# Patient Record
Sex: Male | Born: 1970 | Race: White | Hispanic: No | Marital: Married | Smoking: Never smoker
Health system: Southern US, Community
[De-identification: ages and names within clinical notes are randomized; demographics above are authoritative.]

## PROBLEM LIST (undated history)

## (undated) DIAGNOSIS — E8881 Metabolic syndrome: Secondary | ICD-10-CM

## (undated) DIAGNOSIS — J309 Allergic rhinitis, unspecified: Secondary | ICD-10-CM

## (undated) DIAGNOSIS — K602 Anal fissure, unspecified: Secondary | ICD-10-CM

## (undated) DIAGNOSIS — J45909 Unspecified asthma, uncomplicated: Secondary | ICD-10-CM

## (undated) DIAGNOSIS — B372 Candidiasis of skin and nail: Secondary | ICD-10-CM

## (undated) DIAGNOSIS — L0591 Pilonidal cyst without abscess: Secondary | ICD-10-CM

## (undated) DIAGNOSIS — S96819A Strain of other specified muscles and tendons at ankle and foot level, unspecified foot, initial encounter: Secondary | ICD-10-CM

## (undated) DIAGNOSIS — M549 Dorsalgia, unspecified: Secondary | ICD-10-CM

## (undated) DIAGNOSIS — E291 Testicular hypofunction: Secondary | ICD-10-CM

## (undated) DIAGNOSIS — N529 Male erectile dysfunction, unspecified: Secondary | ICD-10-CM

## (undated) DIAGNOSIS — K219 Gastro-esophageal reflux disease without esophagitis: Secondary | ICD-10-CM

## (undated) DIAGNOSIS — E669 Obesity, unspecified: Secondary | ICD-10-CM

## (undated) DIAGNOSIS — R7301 Impaired fasting glucose: Secondary | ICD-10-CM

## (undated) DIAGNOSIS — G4733 Obstructive sleep apnea (adult) (pediatric): Secondary | ICD-10-CM

## (undated) HISTORY — DX: Obesity, unspecified: E66.9

## (undated) HISTORY — DX: Testicular hypofunction: E29.1

## (undated) HISTORY — PX: LASIK: SHX215

## (undated) HISTORY — DX: Male erectile dysfunction, unspecified: N52.9

## (undated) HISTORY — DX: Candidiasis of skin and nail: B37.2

## (undated) HISTORY — DX: Metabolic syndrome: E88.81

## (undated) HISTORY — DX: Strain of other specified muscles and tendons at ankle and foot level, unspecified foot, initial encounter: S96.819A

## (undated) HISTORY — DX: Pilonidal cyst without abscess: L05.91

## (undated) HISTORY — DX: Unspecified asthma, uncomplicated: J45.909

## (undated) HISTORY — PX: VASECTOMY: SHX75

## (undated) HISTORY — DX: Obstructive sleep apnea (adult) (pediatric): G47.33

## (undated) HISTORY — DX: Allergic rhinitis, unspecified: J30.9

## (undated) HISTORY — DX: Metabolic syndrome: E88.810

## (undated) HISTORY — DX: Impaired fasting glucose: R73.01

## (undated) HISTORY — DX: Dorsalgia, unspecified: M54.9

## (undated) HISTORY — DX: Anal fissure, unspecified: K60.2

## (undated) HISTORY — DX: Gastro-esophageal reflux disease without esophagitis: K21.9

---

## 2002-12-16 HISTORY — PX: KNEE ARTHROSCOPY: SUR90

## 2003-04-25 ENCOUNTER — Ambulatory Visit (HOSPITAL_BASED_OUTPATIENT_CLINIC_OR_DEPARTMENT_OTHER): Admission: RE | Admit: 2003-04-25 | Discharge: 2003-04-25 | Payer: Self-pay | Admitting: Orthopedic Surgery

## 2013-10-18 ENCOUNTER — Encounter: Payer: Self-pay | Admitting: Gastroenterology

## 2013-11-17 ENCOUNTER — Ambulatory Visit (INDEPENDENT_AMBULATORY_CARE_PROVIDER_SITE_OTHER): Payer: BC Managed Care – PPO | Admitting: Gastroenterology

## 2013-11-17 ENCOUNTER — Encounter: Payer: Self-pay | Admitting: Gastroenterology

## 2013-11-17 VITALS — BP 134/72 | HR 88 | Ht 76.0 in | Wt 255.0 lb

## 2013-11-17 DIAGNOSIS — K921 Melena: Secondary | ICD-10-CM

## 2013-11-17 DIAGNOSIS — Z8371 Family history of colonic polyps: Secondary | ICD-10-CM

## 2013-11-17 MED ORDER — MOVIPREP 100 G PO SOLR: 1.0000 | Freq: Once | ORAL | Status: DC

## 2013-11-17 NOTE — Progress Notes (Signed)
HPI: This is a   very pleasant 42 year old man whom I am meeting for the first time today.  He has seen blood around stool, was told he had fissure.  This occurs rarely.  Not associated with anal pain. Was told to have stool testing prior to his annual PCP visit.  Mother has had colon polyps; Father had colon polyps (has had 3 year intervals twice, now out to 5 years).  Overall weight stable in range.  No significant bowel issues with constipation or dirrhea.  Aunt had colon cancer at age 26.   Review of systems: Pertinent positive and negative review of systems were noted in the above HPI section. Complete review of systems was performed and was otherwise normal.    Past Medical History  Diagnosis Date  . AR (allergic rhinitis)   . Obesity   . Asthma   . GERD (gastroesophageal reflux disease)   . Intertriginous candidiasis   . Anal fissure   . Pilonidal cyst   . Hypogonadism male   . Metabolic syndrome     Past Surgical History  Procedure Laterality Date  . Knee arthroscopy Left 2004    Current Outpatient Prescriptions  Medication Sig Dispense Refill  . TESTIM 50 MG/5GM GEL As directed       No current facility-administered medications for this visit.    Allergies as of 11/17/2013  . (No Known Allergies)    Family History  Problem Relation Age of Onset  . Hypertension Father   . Diabetes Mellitus II Father   . Hyperlipidemia Father   . Thyroid disease Father   . Thyroid disease Mother   . COPD Paternal Grandmother   . Heart attack Paternal Grandfather   . Colon cancer Paternal Aunt 30  . Colon polyps Father   . Colon polyps Mother     History   Social History  . Marital Status: Unknown    Spouse Name: N/A    Number of Children: 3  . Years of Education: N/A   Occupational History  . Passenger transport manager    Social History Main Topics  . Smoking status: Never Smoker   . Smokeless tobacco: Never Used  . Alcohol Use: Yes     Comment: social   . Drug Use: No  . Sexual Activity: Not on file   Other Topics Concern  . Not on file   Social History Narrative   Daily caffeine        Physical Exam: BP 134/72  Pulse 88  Ht 6\' 4"  (1.93 m)  Wt 255 lb (115.667 kg)  BMI 31.05 kg/m2 Constitutional: generally well-appearing Psychiatric: alert and oriented x3 Eyes: extraocular movements intact Mouth: oral pharynx moist, no lesions Neck: supple no lymphadenopathy Cardiovascular: heart regular rate and rhythm Lungs: clear to auscultation bilaterally Abdomen: soft, nontender, nondistended, no obvious ascites, no peritoneal signs, normal bowel sounds Extremities: no lower extremity edema bilaterally Skin: no lesions on visible extremities Rectal exam deferred for upcoming colonoscopy   Assessment and plan: 42 y.o. male with  minor intermittent rectal bleeding, Hemoccult positive stools, family history of both adenomatous colon polyps and colon cancer  He is at elevated risk for colon cancer, he has Hemoccult-positive stools and sees intermittent blood. I suspect the bleeding is anorectal, benign such as a fissure as he was told by his primary care physician. I like to proceed with colonoscopy at his soonest convenience given Hemoccult-positive stool family history.

## 2013-11-17 NOTE — Patient Instructions (Signed)
You will be set up for a colonoscopy (early on a Monday if possible) for heme + stool, FH of colon polyps.

## 2014-01-17 ENCOUNTER — Other Ambulatory Visit: Payer: BC Managed Care – PPO | Admitting: Gastroenterology

## 2015-07-20 ENCOUNTER — Other Ambulatory Visit: Payer: Self-pay | Admitting: Orthopedic Surgery

## 2015-07-31 ENCOUNTER — Encounter (HOSPITAL_BASED_OUTPATIENT_CLINIC_OR_DEPARTMENT_OTHER): Payer: Self-pay | Admitting: *Deleted

## 2015-08-01 NOTE — H&P (Signed)
John Rangel is an 44 y.o. male.    Chief Complaint: Right Knee pain  HPI: John Rangel is here today for evaluation of his right knee.  He was playing soccer 5 days ago had some swelling in the next day had significant pain in his knee.  It is gotten progressively worse.  He is been limping and having trouble going to sleep.  Today the pain is described as constant moderate to severe with a sharp stabbing quality is associated swelling and weakness.  It does wake him from sleep.  Activity and exercise make it worse.  Ice, elevation as helped minimally.  Advil has helped minimally.  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  Past Medical History  Diagnosis Date  . AR (allergic rhinitis)   . Obesity   . Asthma   . GERD (gastroesophageal reflux disease)   . Intertriginous candidiasis   . Anal fissure   . Pilonidal cyst   . Hypogonadism male   . Metabolic syndrome     Past Surgical History  Procedure Laterality Date  . Knee arthroscopy Left 2004  . Vasectomy    . Lasik      Family History  Problem Relation Age of Onset  . Hypertension Father   . Diabetes Mellitus II Father   . Hyperlipidemia Father   . Thyroid disease Father   . Thyroid disease Mother   . COPD Paternal Grandmother   . Heart attack Paternal Grandfather   . Colon cancer Paternal Aunt 30  . Colon polyps Father   . Colon polyps Mother    Social History:  reports that he has never smoked. He has never used smokeless tobacco. He reports that he drinks alcohol. He reports that he does not use illicit drugs.  Allergies: No Known Allergies  No prescriptions prior to admission    No results found for this or any previous visit (from the past 48 hour(s)). No results found.  Review of Systems  Constitutional: Negative.   HENT: Negative.   Eyes: Negative.   Respiratory: Negative.   Cardiovascular: Negative.   Gastrointestinal: Negative.    Genitourinary: Negative.   Musculoskeletal: Positive for joint pain.  Skin: Negative.   Neurological: Negative.   Endo/Heme/Allergies: Negative.   Psychiatric/Behavioral: Negative.     Height  (1.93 m), weight 113.399 kg (250 lb). Physical Exam  Constitutional: He is oriented to person, place, and time. He appears well-developed and well-nourished.  HENT:  Head: Normocephalic and atraumatic.  Eyes: Pupils are equal, round, and reactive to light.  Neck: Normal range of motion. Neck supple.  Cardiovascular: Intact distal pulses.   Respiratory: Effort normal.  Musculoskeletal: He exhibits tenderness.  The right knee has a 1-2+ effusion tender along the superior lateral patellar retinaculum range of motion is from 15-85 with pain on either end of motion.  Skin is intact his neurovascular intact.  Normal pulses to the foot, normal sensation of the foot.  The knee is one plus warm in relation to the contralateral knee  Neurological: He is alert and oriented to person, place, and time.  Skin: Skin is warm and dry.  Psychiatric: He has a normal mood and affect. His behavior is normal. Judgment and thought content normal.     Assessment/Plan Assess:  Symptomatic right knee intra-articular loose body, possible degenerative meniscal tears  Plan:  Options were discussed at length with the patient.  He would like to proceed  with arthroscopic evaluation treatment to include removal of the loose body as well as debridement of any meniscal or chondral lesions.  The risks benefits were discussed at length and all see him back at the time of surgical intervention.  Norco 5 mg by mouth every 6 hours when necessary dispense 60 no refills.  [name] R 08/01/2015, 12:34 PM

## 2015-08-02 ENCOUNTER — Encounter (HOSPITAL_BASED_OUTPATIENT_CLINIC_OR_DEPARTMENT_OTHER)
Admission: RE | Disposition: A | Discharge status: Home / Self Care | Payer: Self-pay | Source: Ambulatory Visit | Attending: Orthopedic Surgery

## 2015-08-02 ENCOUNTER — Ambulatory Visit (HOSPITAL_BASED_OUTPATIENT_CLINIC_OR_DEPARTMENT_OTHER): Payer: BLUE CROSS/BLUE SHIELD | Admitting: Anesthesiology

## 2015-08-02 ENCOUNTER — Ambulatory Visit (HOSPITAL_BASED_OUTPATIENT_CLINIC_OR_DEPARTMENT_OTHER)
Admission: RE | Admit: 2015-08-02 | Discharge: 2015-08-02 | Disposition: A | Discharge status: Home / Self Care | Payer: BLUE CROSS/BLUE SHIELD | Source: Ambulatory Visit | Attending: Orthopedic Surgery | Admitting: Orthopedic Surgery

## 2015-08-02 ENCOUNTER — Encounter (HOSPITAL_BASED_OUTPATIENT_CLINIC_OR_DEPARTMENT_OTHER): Payer: Self-pay | Admitting: *Deleted

## 2015-08-02 DIAGNOSIS — M94261 Chondromalacia, right knee: Secondary | ICD-10-CM

## 2015-08-02 DIAGNOSIS — M2341 Loose body in knee, right knee: Secondary | ICD-10-CM | POA: Insufficient documentation

## 2015-08-02 DIAGNOSIS — M2241 Chondromalacia patellae, right knee: Secondary | ICD-10-CM | POA: Diagnosis not present

## 2015-08-02 DIAGNOSIS — S83241A Other tear of medial meniscus, current injury, right knee, initial encounter: Secondary | ICD-10-CM | POA: Insufficient documentation

## 2015-08-02 DIAGNOSIS — X58XXXA Exposure to other specified factors, initial encounter: Secondary | ICD-10-CM | POA: Insufficient documentation

## 2015-08-02 HISTORY — PX: KNEE ARTHROSCOPY: SHX127

## 2015-08-02 LAB — POCT HEMOGLOBIN-HEMACUE: Hemoglobin: 17.3 g/dL — ABNORMAL HIGH (ref 13.0–17.0)

## 2015-08-02 SURGERY — ARTHROSCOPY, KNEE
Anesthesia: General | Site: Knee | Laterality: Right

## 2015-08-02 MED ORDER — EPINEPHRINE HCL 1 MG/ML IJ SOLN
INTRAMUSCULAR | Status: DC | PRN
  Administered 2015-08-02: 1 mg

## 2015-08-02 MED ORDER — ONDANSETRON HCL 4 MG/2ML IJ SOLN
INTRAMUSCULAR | Status: DC | PRN
  Administered 2015-08-02: 4 mg via INTRAVENOUS

## 2015-08-02 MED ORDER — DEXTROSE-NACL 5-0.45 % IV SOLN: INTRAVENOUS | Status: DC

## 2015-08-02 MED ORDER — HYDROCODONE-ACETAMINOPHEN 5-325 MG PO TABS: 1.0000 | ORAL_TABLET | ORAL | Status: DC | PRN

## 2015-08-02 MED ORDER — LACTATED RINGERS IV SOLN: INTRAVENOUS | Status: DC

## 2015-08-02 MED ORDER — SODIUM CHLORIDE 0.9 % IR SOLN
Status: DC | PRN
  Administered 2015-08-02: 6000 mL

## 2015-08-02 MED ORDER — BUPIVACAINE HCL (PF) 0.5 % IJ SOLN
INTRAMUSCULAR | Status: AC
  Filled 2015-08-02: qty 30

## 2015-08-02 MED ORDER — HYDROMORPHONE HCL 1 MG/ML IJ SOLN
INTRAMUSCULAR | Status: AC
  Filled 2015-08-02: qty 1

## 2015-08-02 MED ORDER — MEPERIDINE HCL 25 MG/ML IJ SOLN: 6.2500 mg | INTRAMUSCULAR | Status: DC | PRN

## 2015-08-02 MED ORDER — GLYCOPYRROLATE 0.2 MG/ML IJ SOLN: 0.2000 mg | Freq: Once | INTRAMUSCULAR | Status: DC | PRN

## 2015-08-02 MED ORDER — MIDAZOLAM HCL 2 MG/2ML IJ SOLN
1.0000 mg | INTRAMUSCULAR | Status: DC | PRN
  Administered 2015-08-02: 2 mg via INTRAVENOUS

## 2015-08-02 MED ORDER — CEFAZOLIN SODIUM-DEXTROSE 2-3 GM-% IV SOLR
INTRAVENOUS | Status: AC
  Filled 2015-08-02: qty 50

## 2015-08-02 MED ORDER — LIDOCAINE HCL (CARDIAC) 20 MG/ML IV SOLN
INTRAVENOUS | Status: DC | PRN
  Administered 2015-08-02: 50 mg via INTRAVENOUS

## 2015-08-02 MED ORDER — LACTATED RINGERS IV SOLN
INTRAVENOUS | Status: DC
  Administered 2015-08-02 (×2): via INTRAVENOUS

## 2015-08-02 MED ORDER — HYDROMORPHONE HCL 1 MG/ML IJ SOLN
0.2500 mg | INTRAMUSCULAR | Status: DC | PRN
  Administered 2015-08-02 (×3): 0.5 mg via INTRAVENOUS

## 2015-08-02 MED ORDER — SCOPOLAMINE 1 MG/3DAYS TD PT72: 1.0000 | MEDICATED_PATCH | Freq: Once | TRANSDERMAL | Status: DC | PRN

## 2015-08-02 MED ORDER — EPINEPHRINE HCL 1 MG/ML IJ SOLN
INTRAMUSCULAR | Status: AC
  Filled 2015-08-02: qty 1

## 2015-08-02 MED ORDER — PROPOFOL 10 MG/ML IV BOLUS
INTRAVENOUS | Status: DC | PRN
  Administered 2015-08-02: 200 mg via INTRAVENOUS

## 2015-08-02 MED ORDER — DEXAMETHASONE SODIUM PHOSPHATE 4 MG/ML IJ SOLN
INTRAMUSCULAR | Status: DC | PRN
  Administered 2015-08-02: 10 mg via INTRAVENOUS

## 2015-08-02 MED ORDER — FENTANYL CITRATE (PF) 100 MCG/2ML IJ SOLN
INTRAMUSCULAR | Status: AC
  Filled 2015-08-02: qty 4

## 2015-08-02 MED ORDER — PROPOFOL 500 MG/50ML IV EMUL
INTRAVENOUS | Status: AC
  Filled 2015-08-02: qty 50

## 2015-08-02 MED ORDER — CEFAZOLIN SODIUM-DEXTROSE 2-3 GM-% IV SOLR
2.0000 g | INTRAVENOUS | Status: AC
  Administered 2015-08-02: 2 g via INTRAVENOUS

## 2015-08-02 MED ORDER — FENTANYL CITRATE (PF) 100 MCG/2ML IJ SOLN
50.0000 ug | INTRAMUSCULAR | Status: AC | PRN
  Administered 2015-08-02: 25 ug via INTRAVENOUS
  Administered 2015-08-02 (×2): 50 ug via INTRAVENOUS
  Administered 2015-08-02: 25 ug via INTRAVENOUS

## 2015-08-02 MED ORDER — PROMETHAZINE HCL 25 MG/ML IJ SOLN: 6.2500 mg | INTRAMUSCULAR | Status: DC | PRN

## 2015-08-02 MED ORDER — BUPIVACAINE-EPINEPHRINE 0.5% -1:200000 IJ SOLN
INTRAMUSCULAR | Status: DC | PRN
  Administered 2015-08-02: 20 mL

## 2015-08-02 MED ORDER — BUPIVACAINE-EPINEPHRINE (PF) 0.5% -1:200000 IJ SOLN
INTRAMUSCULAR | Status: AC
  Filled 2015-08-02: qty 30

## 2015-08-02 MED ORDER — MIDAZOLAM HCL 2 MG/2ML IJ SOLN
INTRAMUSCULAR | Status: AC
  Filled 2015-08-02: qty 2

## 2015-08-02 MED ORDER — CHLORHEXIDINE GLUCONATE 4 % EX LIQD: 60.0000 mL | Freq: Once | CUTANEOUS | Status: DC

## 2015-08-02 SURGICAL SUPPLY — 39 items
BANDAGE ELASTIC 6 VELCRO ST LF (GAUZE/BANDAGES/DRESSINGS) ×2 IMPLANT
BLADE 4.2CUDA (BLADE) IMPLANT
BLADE CUTTER GATOR 3.5 (BLADE) ×2 IMPLANT
BLADE GREAT WHITE 4.2 (BLADE) IMPLANT
BNDG COHESIVE 6X5 TAN STRL LF (GAUZE/BANDAGES/DRESSINGS) ×2 IMPLANT
DRAPE ARTHROSCOPY W/POUCH 114 (DRAPES) ×2 IMPLANT
DURAPREP 26ML APPLICATOR (WOUND CARE) ×2 IMPLANT
ELECT MENISCUS 165MM 90D (ELECTRODE) IMPLANT
ELECT REM PT RETURN 9FT ADLT (ELECTROSURGICAL)
ELECTRODE REM PT RTRN 9FT ADLT (ELECTROSURGICAL) IMPLANT
GAUZE SPONGE 4X4 12PLY STRL (GAUZE/BANDAGES/DRESSINGS) ×2 IMPLANT
GAUZE XEROFORM 1X8 LF (GAUZE/BANDAGES/DRESSINGS) ×2 IMPLANT
GLOVE BIO SURGEON STRL SZ7.5 (GLOVE) ×2 IMPLANT
GLOVE BIO SURGEON STRL SZ8.5 (GLOVE) ×2 IMPLANT
GLOVE BIOGEL PI IND STRL 8 (GLOVE) ×1 IMPLANT
GLOVE BIOGEL PI IND STRL 9 (GLOVE) ×1 IMPLANT
GLOVE BIOGEL PI INDICATOR 8 (GLOVE) ×1
GLOVE BIOGEL PI INDICATOR 9 (GLOVE) ×1
GLOVE SURG SS PI 7.0 STRL IVOR (GLOVE) ×2 IMPLANT
GOWN STRL REUS W/ TWL LRG LVL3 (GOWN DISPOSABLE) ×2 IMPLANT
GOWN STRL REUS W/TWL LRG LVL3 (GOWN DISPOSABLE) ×2
GOWN STRL REUS W/TWL XL LVL3 (GOWN DISPOSABLE) ×2 IMPLANT
IV NS IRRIG 3000ML ARTHROMATIC (IV SOLUTION) ×2 IMPLANT
KNEE WRAP E Z 3 GEL PACK (MISCELLANEOUS) ×2 IMPLANT
MANIFOLD NEPTUNE II (INSTRUMENTS) ×2 IMPLANT
NDL SAFETY ECLIPSE 18X1.5 (NEEDLE) ×1 IMPLANT
NEEDLE HYPO 18GX1.5 SHARP (NEEDLE) ×1
PACK ARTHROSCOPY DSU (CUSTOM PROCEDURE TRAY) ×2 IMPLANT
PACK BASIN DAY SURGERY FS (CUSTOM PROCEDURE TRAY) ×2 IMPLANT
PAD ALCOHOL SWAB (MISCELLANEOUS) ×2 IMPLANT
PENCIL BUTTON HOLSTER BLD 10FT (ELECTRODE) IMPLANT
SET ARTHROSCOPY TUBING (MISCELLANEOUS) ×1
SET ARTHROSCOPY TUBING LN (MISCELLANEOUS) ×1 IMPLANT
SLEEVE SCD COMPRESS KNEE MED (MISCELLANEOUS) IMPLANT
SYR 3ML 18GX1 1/2 (SYRINGE) IMPLANT
SYR 5ML LL (SYRINGE) ×2 IMPLANT
TOWEL OR 17X24 6PK STRL BLUE (TOWEL DISPOSABLE) ×2 IMPLANT
WAND STAR VAC 90 (SURGICAL WAND) IMPLANT
WATER STERILE IRR 1000ML POUR (IV SOLUTION) ×2 IMPLANT

## 2015-08-02 NOTE — Interval H&P Note (Signed)
History and Physical Interval Note:  08/02/2015 11:35 AM  John Rangel  has presented today for surgery, with the diagnosis of RIGHT KNEE LOOSE BODY M23.41  The various methods of treatment have been discussed with the patient and family. After consideration of risks, benefits and other options for treatment, the patient has consented to  Procedure(s): ARTHROSCOPY KNEE (Right) as a surgical intervention .  The patient's history has been reviewed, patient examined, no change in status, stable for surgery.  I have reviewed the patient's chart and labs.  Questions were answered to the patient's satisfaction.     Nestor Lewandowsky

## 2015-08-02 NOTE — Anesthesia Preprocedure Evaluation (Signed)
Anesthesia Evaluation  Patient identified by MRN, date of birth, ID band Patient awake    Reviewed: Allergy & Precautions, NPO status , Patient's Chart, lab work & pertinent test results  Airway Mallampati: II       Dental  (+) Teeth Intact   Pulmonary asthma ,  breath sounds clear to auscultation        Cardiovascular negative cardio ROS  Rhythm:Regular Rate:Normal     Neuro/Psych negative neurological ROS  negative psych ROS   GI/Hepatic Neg liver ROS, GERD-  ,  Endo/Other  negative endocrine ROS  Renal/GU negative Renal ROS  negative genitourinary   Musculoskeletal negative musculoskeletal ROS (+)   Abdominal   Peds negative pediatric ROS (+)  Hematology negative hematology ROS (+)   Anesthesia Other Findings   Reproductive/Obstetrics negative OB ROS                             Anesthesia Physical Anesthesia Plan  ASA: II  Anesthesia Plan: General   Post-op Pain Management:    Induction: Intravenous  Airway Management Planned: LMA  Additional Equipment:   Intra-op Plan:   Post-operative Plan: Extubation in OR  Informed Consent: I have reviewed the patients History and Physical, chart, labs and discussed the procedure including the risks, benefits and alternatives for the proposed anesthesia with the patient or authorized representative who has indicated his/her understanding and acceptance.   Dental advisory given  Plan Discussed with: CRNA  Anesthesia Plan Comments:         Anesthesia Quick Evaluation

## 2015-08-02 NOTE — Anesthesia Postprocedure Evaluation (Signed)
  Anesthesia Post-op Note  Patient: John Rangel  Procedure(s) Performed: Procedure(s): KNEE ARTHROSCOPY WITH REMOVAL LOOSE BODIES AND DEBRIDEMENT CHONDROMALACIA (Right)  Patient Location: PACU  Anesthesia Type:General  Level of Consciousness: awake and alert   Airway and Oxygen Therapy: Patient Spontanous Breathing  Post-op Pain: Controlled  Post-op Assessment: Post-op Vital signs reviewed, Patient's Cardiovascular Status Stable and Respiratory Function Stable  Post-op Vital Signs: Reviewed  Filed Vitals:   08/02/15 1330  BP: 126/88  Pulse: 83  Temp:   Resp: 11    Complications: No apparent anesthesia complications

## 2015-08-02 NOTE — Transfer of Care (Signed)
Immediate Anesthesia Transfer of Care Note  Patient: John Rangel  Procedure(s) Performed: Procedure(s): KNEE ARTHROSCOPY WITH REMOVAL LOOSE BODIES AND DEBRIDEMENT CHONDROMALACIA (Right)  Patient Location: PACU  Anesthesia Type:General  Level of Consciousness: awake, oriented, sedated and patient cooperative  Airway & Oxygen Therapy: Patient Spontanous Breathing and Patient connected to face mask oxygen  Post-op Assessment: Report given to RN and Post -op Vital signs reviewed and stable  Post vital signs: Reviewed and stable  Last Vitals:  Filed Vitals:   08/02/15 1031  BP: 126/87  Pulse: 73  Temp: 36.9 C  Resp: 18    Complications: No apparent anesthesia complications

## 2015-08-02 NOTE — Op Note (Signed)
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  Surgeon: Feliberto Gottron. Turner Daniels M.D.  Assist: Tomi Likens. Gaylene Brooks  (present throughout entire procedure and necessary for timely completion of the procedure) Anes: General LMA  EBL: Minimal  Fluids: 800 cc   Indications: Patient has catching popping and pain in his right knee. Plain x-rays on the office showed an intra-articular loose body that was osteochondral in the suprapatellar pouch.. Pt has failed conservative treatment with anti-inflammatory medicines, physical therapy, and modified activites. Patient desires elective arthroscopic evaluation and treatment of knee. Risks and benefits of surgery have been discussed and questions answered.  Procedure: Patient identified by arm band and taken to the operating room at the day surgery Center. The appropriate anesthetic monitors were attached, and General LMA anesthesia was induced without difficulty. Lateral post was applied to the table and the lower extremity was prepped and draped in usual sterile fashion from the ankle to the midthigh. Time out procedure was performed. We began the operation by making standard inferior lateral and inferior medial peripatellar portals with a #11 blade allowing introduction of the arthroscope through the inferior lateral portal and the out flow to the inferior medial portal. Pump pressure was set at 100 mmHg and diagnostic arthroscopy  revealed grade 3 chondromalacia with flap tears to the trochlea is debrider back to a stable margin with 35 Gator sucker shaver and straight biters. There was grade 3 chondromalacia flap tears to the patella  especially the lateral facet likewise debrided back to stable margins. Moving into the notch we identified an osteochondral loose body 7 mm in diameter that was removed with the Gator sucker shaver. The anterior horn of the medial meniscus was torn and also resected. The medial femoral condyle medial tibial plateau had some focal grade 3 chondromalacia lightly debrided. On the lateral side the lateral tibial plateau had grade 3 chondral malacia flap tears near the notch debrided over a 1 cm x 5 mm area. The lateral meniscus was in relatively good condition. The gutters were cleared medially and laterally. We spent a significant amount of time exploring suprapatellar pouch to see if there was a large loose body in this region that was seen on the x-ray but more likely than not that osteochondral structure was probably extra-articular. The knee was irrigated out normal saline solution. A dressing of xerofoam 4 x 4 dressing sponges, web roll and an Ace wrap was applied. The patient was awakened extubated and taken to the recovery without difficulty.    Signed: Nestor Lewandowsky, MD

## 2015-08-02 NOTE — Discharge Instructions (Addendum)
Arthroscopic Procedure, Knee °An arthroscopic procedure can find what is wrong with your knee. °PROCEDURE °Arthroscopy is a surgical technique that allows your orthopedic surgeon to diagnose and treat your knee injury with accuracy. They will look into your knee through a small instrument. This is almost like a small (pencil sized) telescope. Because arthroscopy affects your knee less than open knee surgery, you can anticipate a more rapid recovery. Taking an active role by following your caregiver's instructions will help with rapid and complete recovery. Use crutches, rest, elevation, ice, and knee exercises as instructed. The length of recovery depends on various factors including type of injury, age, physical condition, medical conditions, and your rehabilitation. °Your knee is the joint between the large bones (femur and tibia) in your leg. Cartilage covers these bone ends which are smooth and slippery and allow your knee to bend and move smoothly. Two menisci, thick, semi-lunar shaped pads of cartilage which form a rim inside the joint, help absorb shock and stabilize your knee. Ligaments bind the bones together and support your knee joint. Muscles move the joint, help support your knee, and take stress off the joint itself. Because of this all programs and physical therapy to rehabilitate an injured or repaired knee require rebuilding and strengthening your muscles. °AFTER THE PROCEDURE °· After the procedure, you will be moved to a recovery area until most of the effects of the medication have worn off. Your caregiver will discuss the test results with you. °· Only take over-the-counter or prescription medicines for pain, discomfort, or fever as directed by your caregiver. °SEEK MEDICAL CARE IF:  °· You have increased bleeding from your wounds. °· You see redness, swelling, or have increasing pain in your wounds. °· You have pus coming from your wound. °· You have an oral temperature above 102° F (38.9°  C). °· You notice a bad smell coming from the wound or dressing. °· You have severe pain with any motion of your knee. °SEEK IMMEDIATE MEDICAL CARE IF:  °· You develop a rash. °· You have difficulty breathing. °· You have any allergic problems. °Document Released: 11/29/2000 Document Revised: 02/24/2012 Document Reviewed: 06/22/2008 °[employer]® Patient Information ©2015 [employer]. This information is not intended to replace advice given to you by your health care provider. Make sure you discuss any questions you have with your health care provider. ° ° °Post Anesthesia Home Care Instructions ° °Activity: °Get plenty of rest for the remainder of the day. A responsible adult should stay with you for 24 hours following the procedure.  °For the next 24 hours, DO NOT: °-Drive a car °-Operate machinery °-Drink alcoholic beverages °-Take any medication unless instructed by your physician °-Make any legal decisions or sign important papers. ° °Meals: °Start with liquid foods such as gelatin or soup. Progress to regular foods as tolerated. Avoid greasy, spicy, heavy foods. If nausea and/or vomiting occur, drink only clear liquids until the nausea and/or vomiting subsides. Call your physician if vomiting continues. ° °Special Instructions/Symptoms: °Your throat may feel dry or sore from the anesthesia or the breathing tube placed in your throat during surgery. If this causes discomfort, gargle with warm salt water. The discomfort should disappear within 24 hours. ° °If you had a scopolamine patch placed behind your ear for the management of post- operative nausea and/or vomiting: ° °1. The medication in the patch is effective for 72 hours, after which it should be removed.  Wrap patch in a tissue and discard in the trash. Wash   hands thoroughly with soap and water. °2. You may remove the patch earlier than 72 hours if you experience unpleasant side effects which may include dry mouth, dizziness or visual disturbances. °3.  Avoid touching the patch. Wash your hands with soap and water after contact with the patch. °  ° °

## 2015-08-02 NOTE — Anesthesia Procedure Notes (Signed)
Procedure Name: LMA Insertion Date/Time: 08/02/2015 11:54 AM Performed by: Gar Gibbon Pre-anesthesia Checklist: Patient identified, Emergency Drugs available, Suction available and Patient being monitored Patient Re-evaluated:Patient Re-evaluated prior to inductionOxygen Delivery Method: Circle System Utilized Preoxygenation: Pre-oxygenation with 100% oxygen Intubation Type: IV induction Ventilation: Mask ventilation without difficulty LMA: LMA inserted LMA Size: 5.0 Number of attempts: 1 Airway Equipment and Method: Bite block Placement Confirmation: positive ETCO2 Tube secured with: Tape Dental Injury: Teeth and Oropharynx as per pre-operative assessment

## 2015-08-03 ENCOUNTER — Encounter (HOSPITAL_BASED_OUTPATIENT_CLINIC_OR_DEPARTMENT_OTHER): Payer: Self-pay | Admitting: Orthopedic Surgery

## 2016-05-10 DIAGNOSIS — R05 Cough: Secondary | ICD-10-CM | POA: Diagnosis not present

## 2016-05-10 DIAGNOSIS — R000 Tachycardia, unspecified: Secondary | ICD-10-CM | POA: Diagnosis not present

## 2016-05-10 DIAGNOSIS — R0600 Dyspnea, unspecified: Secondary | ICD-10-CM | POA: Diagnosis not present

## 2016-05-10 DIAGNOSIS — J111 Influenza due to unidentified influenza virus with other respiratory manifestations: Secondary | ICD-10-CM | POA: Diagnosis not present

## 2016-08-20 DIAGNOSIS — H52222 Regular astigmatism, left eye: Secondary | ICD-10-CM | POA: Diagnosis not present

## 2016-09-03 DIAGNOSIS — R7301 Impaired fasting glucose: Secondary | ICD-10-CM | POA: Diagnosis not present

## 2016-09-03 DIAGNOSIS — E669 Obesity, unspecified: Secondary | ICD-10-CM | POA: Diagnosis not present

## 2016-09-03 DIAGNOSIS — E8881 Metabolic syndrome: Secondary | ICD-10-CM | POA: Diagnosis not present

## 2016-09-03 DIAGNOSIS — E298 Other testicular dysfunction: Secondary | ICD-10-CM | POA: Diagnosis not present

## 2016-09-03 DIAGNOSIS — Z0000 Encounter for general adult medical examination without abnormal findings: Secondary | ICD-10-CM | POA: Diagnosis not present

## 2016-09-04 DIAGNOSIS — E8881 Metabolic syndrome: Secondary | ICD-10-CM | POA: Diagnosis not present

## 2016-09-04 DIAGNOSIS — I8390 Asymptomatic varicose veins of unspecified lower extremity: Secondary | ICD-10-CM | POA: Diagnosis not present

## 2016-09-04 DIAGNOSIS — R400 Somnolence: Secondary | ICD-10-CM | POA: Diagnosis not present

## 2016-09-04 DIAGNOSIS — E298 Other testicular dysfunction: Secondary | ICD-10-CM | POA: Diagnosis not present

## 2016-09-04 DIAGNOSIS — Z0000 Encounter for general adult medical examination without abnormal findings: Secondary | ICD-10-CM | POA: Diagnosis not present

## 2016-09-04 DIAGNOSIS — Z23 Encounter for immunization: Secondary | ICD-10-CM | POA: Diagnosis not present

## 2016-09-04 DIAGNOSIS — Z1389 Encounter for screening for other disorder: Secondary | ICD-10-CM | POA: Diagnosis not present

## 2016-09-17 ENCOUNTER — Ambulatory Visit (INDEPENDENT_AMBULATORY_CARE_PROVIDER_SITE_OTHER): Payer: BLUE CROSS/BLUE SHIELD | Admitting: Neurology

## 2016-09-17 ENCOUNTER — Encounter: Payer: Self-pay | Admitting: Neurology

## 2016-09-17 VITALS — BP 118/70 | HR 82 | Resp 18 | Ht 76.0 in | Wt 263.0 lb

## 2016-09-17 DIAGNOSIS — Z9189 Other specified personal risk factors, not elsewhere classified: Secondary | ICD-10-CM | POA: Diagnosis not present

## 2016-09-17 DIAGNOSIS — R0683 Snoring: Secondary | ICD-10-CM | POA: Diagnosis not present

## 2016-09-17 DIAGNOSIS — E669 Obesity, unspecified: Secondary | ICD-10-CM | POA: Diagnosis not present

## 2016-09-17 DIAGNOSIS — G479 Sleep disorder, unspecified: Secondary | ICD-10-CM | POA: Diagnosis not present

## 2016-09-17 DIAGNOSIS — G4719 Other hypersomnia: Secondary | ICD-10-CM

## 2016-09-17 DIAGNOSIS — R0681 Apnea, not elsewhere classified: Secondary | ICD-10-CM

## 2016-09-17 DIAGNOSIS — Z789 Other specified health status: Secondary | ICD-10-CM

## 2016-09-17 NOTE — Progress Notes (Signed)
Subjective:    Patient ID: John Rangel is a 45 y.o. male.  HPI     John Foley, MD, PhD [facility] [address], [address] [address] [place], [place] [address]  Dear Dr. Clelia Croft,   I saw your patient, John Rangel, upon your kind request in my neurologic clinic today for initial consultation of his sleep disorder, in particular, concern for underlying obstructive sleep apnea. The patient is unaccompanied today. As you know, John Rangel is a 45 year old right-handed gentleman with an underlying medical history of allergic rhinitis, asthma, reflux disease, hypogonadism, and obesity, s/p arthroscopic knee surgeries, back pain, who reports snoring and excessive daytime somnolence. Wife has witnessed pauses in his breathing while he is asleep. His Epworth sleepiness score is 13 out of 24 today, fatigue score is 22 out of 63. He is a nonsmoker. He drinks alcohol occasionally, 4-6 drinks every other week or so. He does drink a lot of sodas, 5-6 cans per day. He lives with his wife and 3 daughters.  TV is on at night, on a sleep timer, 60-120 minutes. He denies morning headaches. He denies nocturia on a night to night basis. A few years ago he was able to lose weight but regained it. 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 He suffers from recurrent allergies including itching of his eyes. He takes over-the-counter eyedrops for this. He often sleeps with his mouth open. For intermittent low back pain he has been taking Aleve and as needed Flexeril but this makes him very sleepy. He denies RLS symptoms, or leg twitching at night. He has no FHx of OSA as far as he can tell.  He goes to bed around 10:30 or 11 PM and wake time is around 6 AM.  I reviewed your office note from 09/04/2016, which you kindly included.   His Past Medical History Is Significant For: Past Medical History:  Diagnosis Date  . Anal fissure   . AR (allergic rhinitis)   . Asthma    . ED (erectile dysfunction)   . GERD (gastroesophageal reflux disease)   . Hypogonadism male   . Impaired fasting glucose   . Intertriginous candidiasis   . Metabolic syndrome   . Metabolic syndrome X   . Obesity   . Pilonidal cyst     His Past Surgical History Is Significant For: Past Surgical History:  Procedure Laterality Date  . KNEE ARTHROSCOPY Left 2004  . KNEE ARTHROSCOPY Right 08/02/2015   Procedure: KNEE ARTHROSCOPY WITH REMOVAL LOOSE BODIES AND DEBRIDEMENT CHONDROMALACIA;  Surgeon: Gean Birchwood, MD;  Location: [facility];  Service: Orthopedics;  Laterality: Right;  . LASIK    . VASECTOMY      His Family History Is Significant For: Family History  Problem Relation Age of Onset  . Hypertension Father   . Diabetes Mellitus II Father   . Hyperlipidemia Father   . Thyroid disease Father   . Colon polyps Father   . Thyroid disease Mother   . Colon polyps Mother   . COPD Paternal Grandmother   . Heart attack Paternal Grandfather   . Colon cancer Paternal Aunt 30    His Social History Is Significant For: Social History   Social History  . Marital status: Married    Spouse name: Vikki Ports   . Number of children: 3  . Years of education: Assoc   Occupational History  . Passenger transport manager    Social History  Main Topics  . Smoking status: Never Smoker  . Smokeless tobacco: Never Used  . Alcohol use Yes     Comment: social  . Drug use: No  . Sexual activity: Yes   Other Topics Concern  . None   Social History Narrative   Daily caffeine 5-6 12oz drinks     His Allergies Are:  No Known Allergies:   His Current Medications Are:  Outpatient Encounter Prescriptions as of 09/17/2016  Medication Sig  . cyclobenzaprine (FLEXERIL) 10 MG tablet Take 10 mg by mouth 3 (three) times daily as needed for muscle spasms.  . fluocinolone (VANOS) 0.01 % cream Apply topically as needed.  Marland Kitchen ibuprofen (ADVIL,MOTRIN) 800 MG tablet Take 800 mg by mouth every  8 (eight) hours as needed.  Marland Kitchen ketotifen (ALAWAY) 0.025 % ophthalmic solution 1 drop 2 (two) times daily.  . TESTIM 50 MG/5GM GEL As directed  . [DISCONTINUED] albuterol (PROVENTIL HFA;VENTOLIN HFA) 108 (90 BASE) MCG/ACT inhaler Inhale into the lungs every 6 (six) hours as needed for wheezing or shortness of breath.  . [DISCONTINUED] HYDROcodone-acetaminophen (NORCO/VICODIN) 5-325 MG per tablet Take 1 tablet by mouth every 4 (four) hours as needed for moderate pain.   No facility-administered encounter medications on file as of 09/17/2016.   :  Review of Systems:  Out of a complete 14 point review of systems, all are reviewed and negative with the exception of these symptoms as listed below: Review of Systems  Neurological:       Some trouble staying asleep, snoring, witnessed apnea, wakes up feeling tired, daytime fatigue, denies taking naps.    Epworth Sleepiness Scale 0= would never doze 1= slight chance of dozing 2= moderate chance of dozing 3= high chance of dozing  Sitting and reading:3 Watching TV:3 Sitting inactive in a public place (ex. Theater or meeting):1 As a passenger in a car for an hour without a break:2 Lying down to rest in the afternoon:2 Sitting and talking to someone:0 Sitting quietly after lunch (no alcohol):2 In a car, while stopped in traffic:0 Total:13  Objective:  Neurologic Exam  Physical Exam Physical Examination:   Vitals:   09/17/16 1438  BP: 118/70  Pulse: 82  Resp: 18    General Examination: The patient is a very pleasant 45 y.o. male in no acute distress. He appears well-developed and well-nourished and well groomed.   HEENT: Normocephalic, atraumatic, pupils are equal, round and reactive to light and accommodation. Funduscopic exam is normal with sharp disc margins noted. Extraocular tracking is good without limitation to gaze excursion or nystagmus noted. Normal smooth pursuit is noted. Hearing is grossly intact. Tympanic membranes are  clear bilaterally. Face is symmetric with normal facial animation and normal facial sensation. Speech is clear with no dysarthria noted. There is no hypophonia. There is no lip, neck/head, jaw or voice tremor. Neck is supple with full range of passive and active motion. There are no carotid bruits on auscultation. Oropharynx exam reveals: moderate mouth dryness, good dental hygiene and moderate airway crowding, due to larger appearing uvula, tonsils in place, mild erythema. Mallampati is class II. Tongue protrudes centrally and palate elevates symmetrically. Tonsils are 1+. Neck size is 18 1/8 inches. He has a Absent overbite/minimal underbite. Nasal inspection reveals no significant nasal mucosal bogginess or redness and no septal deviation.   Chest: Clear to auscultation without wheezing, rhonchi or crackles noted.  Heart: S1+S2+0, regular and normal without murmurs, rubs or gallops noted.   Abdomen: Soft, non-tender and non-distended  with normal bowel sounds appreciated on auscultation.  Extremities: There is no pitting edema in the distal lower extremities bilaterally. Pedal pulses are intact.  Skin: Warm and dry without trophic changes noted. There are no varicose veins.  Musculoskeletal: exam reveals no obvious joint deformities, tenderness or joint swelling or erythema.   Neurologically:  Mental status: The patient is awake, alert and oriented in all 4 spheres. His immediate and remote memory, attention, language skills and fund of knowledge are appropriate. There is no evidence of aphasia, agnosia, apraxia or anomia. Speech is clear with normal prosody and enunciation. Thought process is linear. Mood is normal and affect is normal.  Cranial nerves II - XII are as described above under HEENT exam. In addition: shoulder shrug is normal with equal shoulder height noted. Motor exam: Normal bulk, strength and tone is noted. There is no drift, tremor or rebound. Romberg is negative. Reflexes are  2+ throughout. Babinski: Toes are flexor bilaterally. Fine motor skills and coordination: intact with normal finger taps, normal hand movements, normal rapid alternating patting, normal foot taps and normal foot agility.  Cerebellar testing: No dysmetria or intention tremor on finger to nose testing. Heel to shin is unremarkable bilaterally. There is no truncal or gait ataxia.  Sensory exam: intact to light touch, pinprick, vibration, temperature sense in the upper and lower extremities.  Gait, station and balance: He stands easily. No veering to one side is noted. No leaning to one side is noted. Posture is age-appropriate and stance is narrow based. Gait shows normal stride length and normal pace. No problems turning are noted. Tandem walk is unremarkable. Intact toe and heel stance is noted.               Assessment and Plan:   In summary, Joni FearsMark D Mccubbin is a very pleasant 45 y.o.-year old male with an underlying medical history of allergic rhinitis, asthma, reflux disease, hypogonadism, and obesity, whose history and physical exam are concerning for obstructive sleep apnea (OSA).  I had a long chat with the patient about my findings and the diagnosis of OSA, its prognosis and treatment options. We talked about medical treatments, surgical interventions and non-pharmacological approaches. I explained in particular the risks and ramifications of untreated moderate to severe OSA, especially with respect to developing cardiovascular disease down the Road, including congestive heart failure, difficult to treat hypertension, cardiac arrhythmias, or stroke. Even type 2 diabetes has, in part, been linked to untreated OSA. Symptoms of untreated OSA include daytime sleepiness, memory problems, mood irritability and mood disorder such as depression and anxiety, lack of energy, as well as recurrent headaches, especially morning headaches. We talked about trying to maintain a healthy lifestyle in general, as well as  the importance of weight control. I encouraged the patient to eat healthy, exercise daily and keep well hydrated, to keep a scheduled bedtime and wake time routine, to not skip any meals and eat healthy snacks in between meals. I advised the patient not to drive when feeling sleepy. I recommended the following at this time: sleep study with potential positive airway pressure titration. (We will score hypopneas at 3% and split the sleep study into diagnostic and treatment portion, if the estimated. 2 hour AHI is >15/h).   I explained the sleep test procedure to the patient and also outlined possible surgical and non-surgical treatment options of OSA, including the use of a custom-made dental device (which would require a referral to a specialist dentist or oral surgeon), upper  airway surgical options, such as pillar implants, radiofrequency surgery, tongue base surgery, and UPPP (which would involve a referral to an ENT surgeon). Rarely, jaw surgery such as mandibular advancement may be considered.  I also explained the CPAP treatment option to the patient, who indicated that he would be willing to try CPAP if the need arises. I explained the importance of being compliant with PAP treatment, not only for insurance purposes but primarily to improve His symptoms, and for the patient's long term health benefit, including to reduce His cardiovascular risks. I answered all his questions today and the patient was in agreement. I would like to see him back after the sleep study is completed and encouraged him to call with any interim questions, concerns, problems or updates.   Thank you very much for allowing me to participate in the care of this nice patient. If I can be of any further assistance to you please do not hesitate to call me at [phone].  Sincerely,   Star Age, MD, PhD

## 2016-09-17 NOTE — Patient Instructions (Addendum)
Based on your symptoms and your exam I believe you are at risk for obstructive sleep apnea or OSA, and I think we should proceed with a sleep study to determine whether you do or do not have OSA and how severe it is. If you have more than mild OSA, I want you to consider treatment with CPAP. Please remember, the risks and ramifications of moderate to severe obstructive sleep apnea or OSA are: Cardiovascular disease, including congestive heart failure, stroke, difficult to control hypertension, arrhythmias, and even type 2 diabetes has been linked to untreated OSA. Sleep apnea causes disruption of sleep and sleep deprivation in most cases, which, in turn, can cause recurrent headaches, problems with memory, mood, concentration, focus, and vigilance. Most people with untreated sleep apnea report excessive daytime sleepiness, which can affect their ability to drive. Please do not drive if you feel sleepy.   I will likely see you back after your sleep study to go over the test results and where to go from there. We will call you after your sleep study to advise about the results (most likely, you will hear from Lafonda Mossesiana, my nurse) and to set up an appointment at the time, as necessary.    Our sleep lab administrative assistant, Alvis LemmingsDawn will meet with you or call you to schedule your sleep study. If you don't hear back from her by next week please feel free to call her at [phone]. This is her direct line and please leave a message with your phone number to call back if you get the voicemail box. She will call back as soon as possible.   Please remember to try to maintain good sleep hygiene, which means: Keep a regular sleep and wake schedule, try not to exercise or have a meal within 2 hours of your bedtime, try to keep your bedroom conducive for sleep, that is, cool and dark, without light distractors such as an illuminated alarm clock, and refrain from watching TV right before sleep or in the middle of the night  and do not keep the TV or radio on during the night. Also, try not to use or play on electronic devices at bedtime, such as your cell phone, tablet PC or laptop. If you like to read at bedtime on an electronic device, try to dim the background light as much as possible. Do not eat in the middle of the night.   Please reduce your caffeine intake.

## 2016-10-08 DIAGNOSIS — Z6832 Body mass index (BMI) 32.0-32.9, adult: Secondary | ICD-10-CM | POA: Diagnosis not present

## 2016-10-08 DIAGNOSIS — L258 Unspecified contact dermatitis due to other agents: Secondary | ICD-10-CM | POA: Diagnosis not present

## 2016-10-09 ENCOUNTER — Ambulatory Visit (INDEPENDENT_AMBULATORY_CARE_PROVIDER_SITE_OTHER): Payer: BLUE CROSS/BLUE SHIELD | Admitting: Neurology

## 2016-10-09 DIAGNOSIS — G4761 Periodic limb movement disorder: Secondary | ICD-10-CM

## 2016-10-09 DIAGNOSIS — G4733 Obstructive sleep apnea (adult) (pediatric): Secondary | ICD-10-CM | POA: Diagnosis not present

## 2016-10-15 ENCOUNTER — Telehealth: Payer: Self-pay | Admitting: Neurology

## 2016-10-15 DIAGNOSIS — G4733 Obstructive sleep apnea (adult) (pediatric): Secondary | ICD-10-CM

## 2016-10-15 NOTE — Progress Notes (Signed)
PATIENT'S NAME:  John Rangel, John Rangel DOB:      04-04-1971      MR#:    [id number]     DATE OF RECORDING: 10/09/2016 REFERRING M.D.:  Carolin Coy, MD Study Performed:  Split-Night Titration Study HISTORY:  45 year old right-handed gentleman with an underlying medical history of allergic rhinitis, asthma, reflux disease, hypogonadism, and obesity, s/p arthroscopic knee surgeries, back pain, who reports snoring and excessive daytime somnolence. The patient endorsed the Epworth Sleepiness Scale at 13 points.   The patient's weight 262 pounds with a height of 76 (inches), resulting in a BMI of 31.9 kg/m2. The patient's neck circumference measured 18 inches.  CURRENT MEDICATIONS: Flexeril, Vanos, Advil, Alaway, Testim    PROCEDURE:  This is a multichannel digital polysomnogram utilizing the Somnostar 11.2 system.  Electrodes and sensors were applied and monitored per AASM Specifications.   EEG, EOG, Chin and Limb EMG, were sampled at 200 Hz.  ECG, Snore and Nasal Pressure, Thermal Airflow, Respiratory Effort, CPAP Flow and Pressure, Oximetry was sampled at 50 Hz. Digital video and audio were recorded.      BASELINE STUDY WITHOUT CPAP RESULTS:  Lights Out was at 22:50 and Lights On at 04:59 for the study, split start at 01:23.  Total recording time (TRT) was 153, with a total sleep time (TST) of 112 minutes.   The patient's sleep latency was 37.5 minutes, which is delayed.  REM latency was 74 minutes, which is normal.  The sleep efficiency was 73.2 %.    SLEEP ARCHITECTURE: Moderate sleep fragmentation was noted. Stage N1 was 10.5 minutes, Stage N2 was 78 minutes, Stage N3 was 19.5 minutes and Stage R (REM sleep) was 4 minutes.  The percentages were Stage N1 9.4%, Stage N2 69.6%, Stage N3 17.4% and Stage R (REM sleep) 3.6%.   RESPIRATORY ANALYSIS:  There were a total of 101 respiratory events:  48 obstructive apneas, 1 central apneas and 0 mixed apneas with a total of 49 apneas and an apnea index (AI) of 26.3.  There were 52 hypopneas with a hypopnea index of 27.9. The patient also had 0 respiratory event related arousals (RERAs).  Snoring was noted.     The total APNEA/HYPOPNEA INDEX (AHI) was 54.1 /hour and the total RESPIRATORY DISTURBANCE INDEX was 54.1 /hour.  5 events occurred in REM sleep and 95 events in NREM. The REM AHI was 75, /hour versus a non-REM AHI of 53.3 /hour. The patient spent 125.5 minutes sleep time in the supine position 139 minutes in non-supine. The supine AHI was 95.4 /hour versus a non-supine AHI of 0.0 /hour.  Mild to moderate snoring was noted.   EKG was in keeping with NSR.   OXYGEN SATURATION & C02:  The wake baseline 02 saturation was 96%, with the lowest being 73%. Time spent below 89% saturation equaled 57 minutes.   PERIODIC LIMB MOVEMENTS: (Baseline)   The Periodic Limb Movement (PLM) index was 0/hour and the PLM Arousal index was 0 /hour.  TITRATION STUDY WITH CPAP RESULTS:   CPAP was initiated at 5 cmH20 with heated humidity per AASM split night standards and pressure was advanced to 9 cmH20 because of hypopneas, apneas and desaturations.  At a PAP pressure of 9 cmH20, there was a reduction of the AHI to 0/hour. Brief supine REM sleep was achieved. O2 nadir was 93%.   Total recording time (TRT) was 216 minutes, with a total sleep time (TST) of 152.5 minutes. The patient's sleep latency was 61.5 minutes. REM latency  was 60 minutes.  The sleep efficiency was 70.6 %.    SLEEP ARCHITECTURE: Mild sleep fragmentation was noted. Stage N1 3.5 minutes, Stage N2 36.5 minutes, Stage N3 57 minutes and Stage R (REM sleep) 55.5 minutes. The percentages were: Stage N1 2.3%, Stage N2 23.9%, Stage N3 37.4% and Stage R (REM sleep) 36.4%. A full face mask was utilized, per patient tolerance.   RESPIRATORY ANALYSIS:  There were a total of 40 respiratory events: 0 obstructive apneas, 14 central apneas and 5 mixed apneas with a total of 19 apneas and an apnea index (AI) of 7.5. There  were 21 hypopneas with a hypopnea index of 8.3 /hour. The patient also had 1 respiratory event related arousals (RERAs).      The total APNEA/HYPOPNEA INDEX  (AHI) was 15.7 /hour and the total RESPIRATORY DISTURBANCE INDEX was 16.1 /hour.  4 events occurred in REM sleep and 36 events in NREM. The REM AHI was 4.3 /hour versus a non-REM AHI of 22.3 /hour. REM sleep was achieved on a pressure of  cm/h2o (AHI was  .) The patient spent 41% of total sleep time in the supine position. The supine AHI was 38.7 /hour, versus a non-supine AHI of 0.0/hour.  OXYGEN SATURATION & C02:  The wake baseline 02 saturation was 96%, with the lowest being 80%. Time spent below 89% saturation equaled 23 minutes.  PERIODIC LIMB MOVEMENTS: (post treatment)   The patient had a total of 221 Periodic Limb Movements. The Periodic Limb Movement (PLM) index was 87/hour and the PLM Arousal index was 0.4 /hour.  POLYSOMNOGRAPHY IMPRESSION :   1.  Obstructive Sleep Apnea  2.  Periodic Limb Movement Disorder      RECOMMENDATIONS:  1. This patient has severe obstructive sleep apnea and responded well on CPAP therapy. I will, therefore, start the patient on home CPAP treatment at a pressure of 9 cm via FFM, with heated humidity. The patient should be reminded to be fully compliant with PAP therapy to improve sleep related symptoms and decrease long term cardiovascular risks. Please note that untreated obstructive sleep apnea carries additional perioperative morbidity. Patients with significant obstructive sleep apnea should receive perioperative PAP therapy and the surgeons and particularly the anesthesiologist should be informed of the diagnosis and the severity of the sleep disordered breathing. 2. Please note that untreated obstructive sleep apnea carries additional perioperative morbidity. Patients with significant obstructive sleep apnea should receive perioperative PAP therapy and the surgeons and particularly the  anesthesiologist should be informed of the diagnosis and the severity of the sleep disordered breathing. 3. The patient should be cautioned not to drive, work at heights, or operate dangerous or heavy equipment when tired or sleepy. Review and reiteration of good sleep hygiene measures should be pursued with any patient. 4. Severe PLMs (periodic limb movements of sleep) were noted during the treatment portion of the study only and without significant arousals; clinical correlation is recommended. This could indicate adjustment to CPAP therapy.  5. The patient will be seen in [facility] for discussion of the test results and further management strategies. The referring provider will be notified of the test results.   I certify that I have reviewed the entire raw data recording prior to the issuance of this report in accordance with the Standards of Accreditation of the American Academy of Sleep Medicine (AASM)       Huston FoleySaima Yarnell Kozloski, MD,PhD Diplomat, American Board of Psychiatry and Neurology  Diplomat, American Board of  Sleep Medicine

## 2016-10-15 NOTE — Telephone Encounter (Signed)
I spoke to patient. He is aware of results and recommendations. He is willing to proceed with treatment. I will send orders to [facility]. I will send report to PCP. Patient will receive a letter reminding him to make f/u appt and stress the importance of compliance.

## 2016-10-15 NOTE — Telephone Encounter (Signed)
Diana:  Patient referred by Dr. Clelia CroftShaw, seen by me on 09/17/16, split study on 10/09/16.  Please call and notify patient that the recent sleep study confirmed the diagnosis of severe OSA. He did well with CPAP during the study with significant improvement of the respiratory events. Therefore, I would like start the patient on CPAP therapy at home by prescribing a machine for home use. I placed the order in the chart. The patient will need a follow up appointment with me in 8 to 10 weeks post set up that has to be scheduled; please go ahead and schedule while you have the patient on the phone and make sure patient understands the importance of keeping this window for the FU appointment, as it is often an insurance requirement and failing to adhere to this may result in losing coverage for sleep apnea treatment.  Please re-enforce the importance of compliance with treatment and the need for us to monitor compliance data - again an insurance requirement and good feedback for the patient as far as how they are doing.  Also remind patient, that any upcoming CPAP machine or mask issues, should be first addressed with the DME company. Please ask if patient has a preference regarding DME company.  Please arrange for CPAP set up at home through a DME company of patient's choice - once you have spoken to the patient - and faxed/routed report to PCP and referring MD (if other than PCP), you can close this encounter, thanks,   Huston FoleySaima Remer Couse, MD, PhD [facility]

## 2016-10-22 DIAGNOSIS — G4733 Obstructive sleep apnea (adult) (pediatric): Secondary | ICD-10-CM | POA: Diagnosis not present

## 2016-11-21 DIAGNOSIS — G4733 Obstructive sleep apnea (adult) (pediatric): Secondary | ICD-10-CM | POA: Diagnosis not present

## 2017-01-05 DIAGNOSIS — J111 Influenza due to unidentified influenza virus with other respiratory manifestations: Secondary | ICD-10-CM | POA: Diagnosis not present

## 2017-01-08 ENCOUNTER — Ambulatory Visit (INDEPENDENT_AMBULATORY_CARE_PROVIDER_SITE_OTHER): Payer: BLUE CROSS/BLUE SHIELD | Admitting: Neurology

## 2017-01-08 ENCOUNTER — Encounter: Payer: Self-pay | Admitting: Neurology

## 2017-01-08 VITALS — BP 110/72 | HR 78 | Resp 18 | Ht 76.0 in | Wt 254.0 lb

## 2017-01-08 DIAGNOSIS — E669 Obesity, unspecified: Secondary | ICD-10-CM | POA: Diagnosis not present

## 2017-01-08 DIAGNOSIS — G4733 Obstructive sleep apnea (adult) (pediatric): Secondary | ICD-10-CM | POA: Diagnosis not present

## 2017-01-08 DIAGNOSIS — G479 Sleep disorder, unspecified: Secondary | ICD-10-CM

## 2017-01-08 DIAGNOSIS — G4761 Periodic limb movement disorder: Secondary | ICD-10-CM | POA: Diagnosis not present

## 2017-01-08 DIAGNOSIS — Z9989 Dependence on other enabling machines and devices: Secondary | ICD-10-CM

## 2017-01-08 NOTE — Patient Instructions (Signed)
Please continue using your CPAP regularly. While your insurance requires that you use CPAP at least 4 hours each night on 70% of the nights, I recommend, that you not skip any nights and use it throughout the night if you can. Getting used to CPAP and staying with the treatment long term does take time and patience and discipline. Untreated obstructive sleep apnea when it is moderate to severe can have an adverse impact on cardiovascular health and raise her risk for heart disease, arrhythmias, hypertension, congestive heart failure, stroke and diabetes. Untreated obstructive sleep apnea causes sleep disruption, nonrestorative sleep, and sleep deprivation. This can have an impact on your day to day functioning and cause daytime sleepiness and impairment of cognitive function, memory loss, mood disturbance, and problems focussing. Using CPAP regularly can improve these symptoms.  We will do a 2 month FU for CPAP compliance, you can see one of our nurse practitioners. I will see you after that.

## 2017-01-08 NOTE — Progress Notes (Signed)
Subjective:    Patient ID: John Rangel is a 46 y.o. male.  HPI     Interim history:   John Rangel is a 46 year old right-handed gentleman with an underlying medical history of allergic rhinitis, asthma, reflux disease, hypogonadism, and obesity, s/p arthroscopic knee surgeries, back pain, who presents for follow-up consultation of John Rangel sleep disorder, after John Rangel recent split-night sleep study. The patient is unaccompanied today. I first met him on 09/17/2016, at the request of John Rangel primary care physician, at which time John Rangel reported snoring and excessive daytime somnolence as well as witnessed apneic pauses while asleep. I invited him for a sleep study. John Rangel had a split-night sleep study on 10/09/2016. I went over John Rangel test results with him in detail today. Baseline sleep efficiency was 73.2%, John Rangel had moderate sleep fragmentation and an increased percentage of stage I and stage II sleep, a normal percentage of slow-wave sleep and decreased percentage of REM sleep. John Rangel had a normal REM latency. Apnea hypopneas index was 54.1 per hour, rising to 75 per hour during REM sleep and 95.4 per hour in the supine position. John Rangel had mild to moderate snoring, EKG was in keeping with normal sinus rhythm. Average oxygen saturation was 96%, nadir was 73%, time below 89% saturation was 57 minutes. John Rangel had no significant PLMS. John Rangel was then started on CPAP with a pressure of 5 cm. John Rangel was titrated to 9 cm. AHI was 0 per hour with brief supine REM sleep achieved an L2 nadir of 93%. John Rangel had an increased percentage of REM sleep and increased percentage of slow-wave sleep during the treatment portion of the study. Average oxygen saturation was 96%, nadir was 80%. 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 Based on John Rangel test results I prescribed CPAP therapy for home use.   Today, 01/08/2017: I reviewed John Rangel CPAP compliance data from 12/08/2016 through 01/06/2017, which is a total of 30 days, during which  time John Rangel used John Rangel CPAP only 18 days with percent used days greater than 4 hours at 43%, indicating suboptimal compliance with an average usage of 4 hours and 55 minutes, residual AHI 1.7 per hour, leak low with the 95th percentile at 8.5 L/m on a pressure of 9 cm with EPR of 3. I reviewed John Rangel compliance data from 10/22/2016 through 01/06/2017 which is a total of 77 days, during which time John Rangel compliance percentage was 61%, again suboptimal. However, John Rangel fullfilled compliance criteria in the first month of treatment: From 10/26/2016 through 11/24/2016, which is a total of 30 days John Rangel was 97% compliant with usage and 80% compliance with percent used days greater than 4 hours.  Today, 01/08/2017: John Rangel reports that John Rangel is doing better with CPAP therapy. John Rangel had some lapses and treatment because of her recent acute illness, John Rangel had the flu, John Rangel daughter had it first, then John Rangel and John Rangel wife. Weight has been stable, has lost a few lb d/t sickness. Sleeps okay, sleep overall a little better since the CPAP, better rested. Denies RLS symptoms, has occasional knee pain, R>L. Restless sleeper still, some LBP. Not using John Rangel testosterone as regularly. Did not like the silicone based full facemask and switch to the air touch full facemask  Previously:   09/17/2016: John Rangel reports snoring and excessive daytime somnolence. Wife has witnessed pauses in John Rangel breathing while John Rangel is asleep. John Rangel Epworth sleepiness score is 13 out of 24 today, fatigue score is 22 out of 63. John Rangel is a nonsmoker. John Rangel  drinks alcohol occasionally, 4-6 drinks every other week or so. John Rangel does drink a lot of sodas, 5-6 cans per day. John Rangel lives with John Rangel wife and 3 daughters.  TV is on at night, on a sleep timer, 60-120 minutes. John Rangel denies morning headaches. John Rangel denies nocturia on a night to night basis. A few years ago John Rangel was able to lose weight but regained it. 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 John Rangel suffers from recurrent allergies including  itching of John Rangel eyes. John Rangel takes over-the-counter eyedrops for this. John Rangel often sleeps with John Rangel mouth open. For intermittent low back pain John Rangel has been taking Aleve and as needed Flexeril but this makes him very sleepy. John Rangel denies RLS symptoms, or leg twitching at night. John Rangel has no FHx of OSA as far as John Rangel can tell.  John Rangel goes to bed around 10:30 or 11 PM and wake time is around 6 AM.  I reviewed your office note from 09/04/2016, which you kindly included.   John Rangel Past Medical History Is Significant For: Past Medical History:  Diagnosis Date  . Anal fissure   . AR (allergic rhinitis)   . Asthma   . ED (erectile dysfunction)   . GERD (gastroesophageal reflux disease)   . Hypogonadism male   . Impaired fasting glucose   . Intertriginous candidiasis   . Metabolic syndrome   . Metabolic syndrome X   . Obesity   . Pilonidal cyst     John Rangel Past Surgical History Is Significant For: Past Surgical History:  Procedure Laterality Date  . KNEE ARTHROSCOPY Left 2004  . KNEE ARTHROSCOPY Right 08/02/2015   Procedure: KNEE ARTHROSCOPY WITH REMOVAL LOOSE BODIES AND DEBRIDEMENT CHONDROMALACIA;  Surgeon: Frederik Pear, MD;  Location: [facility];  Service: Orthopedics;  Laterality: Right;  . LASIK    . VASECTOMY      John Rangel Family History Is Significant For: Family History  Problem Relation Age of Onset  . Hypertension Father   . Diabetes Mellitus II Father   . Hyperlipidemia Father   . Thyroid disease Father   . Colon polyps Father   . Thyroid disease Mother   . Colon polyps Mother   . COPD Paternal Grandmother   . Heart attack Paternal Grandfather   . Colon cancer Paternal Aunt 26    John Rangel Social History Is Significant For: Social History   Social History  . Marital status: Married    Spouse name: Mateo Flow   . Number of children: 3  . Years of education: Assoc   Occupational History  . Information systems manager    Social History Main Topics  . Smoking status: Never Smoker  . Smokeless  tobacco: Never Used  . Alcohol use Yes     Comment: social  . Drug use: No  . Sexual activity: Yes   Other Topics Concern  . None   Social History Narrative   Daily caffeine 5-6 12oz drinks     John Rangel Allergies Are:  No Known Allergies:   John Rangel Current Medications Are:  Outpatient Encounter Prescriptions as of 01/08/2017  Medication Sig  . cyclobenzaprine (FLEXERIL) 10 MG tablet Take 10 mg by mouth 3 (three) times daily as needed for muscle spasms.  . fluocinolone (VANOS) 0.01 % cream Apply topically as needed.  Marland Kitchen ibuprofen (ADVIL,MOTRIN) 800 MG tablet Take 800 mg by mouth every 8 (eight) hours as needed.  Marland Kitchen ketotifen (ALAWAY) 0.025 % ophthalmic solution 1 drop 2 (two) times daily.  Marland Kitchen  TESTIM 50 MG/5GM GEL As directed   No facility-administered encounter medications on file as of 01/08/2017.   :  Review of Systems:  Out of a complete 14 point review of systems, all are reviewed and negative with the exception of these symptoms as listed below: Review of Systems  Neurological:       Patient states that John Rangel is doing ok with CPAP, has some trouble being comfortable with it. But reports that it helps him overall. John Rangel says that John Rangel has not used this week due to having the flu.     Objective:  Neurologic Exam  Physical Exam Physical Examination:   Vitals:   01/08/17 1406  BP: 110/72  Pulse: 78  Resp: 18    General Examination: The patient is a very pleasant 46 y.o. male in no acute distress. John Rangel appears well-developed and well-nourished and well groomed.   HEENT: Normocephalic, atraumatic, pupils are equal, round and reactive to light and accommodation. Mildly dry eyes noted. Extraocular tracking is good without limitation to gaze excursion or nystagmus noted. Normal smooth pursuit is noted. Hearing is grossly intact. Tympanic membranes are clear bilaterally. Face is symmetric with normal facial animation and normal facial sensation. Speech is clear with no dysarthria noted. There is  no hypophonia. There is no lip, neck/head, jaw or voice tremor. Neck is supple with full range of passive and active motion. There are no carotid bruits on auscultation. Oropharynx exam reveals: moderate mouth dryness, good dental hygiene and moderate airway crowding, due to larger appearing uvula, tonsils in place, mild erythema. Mallampati is class II. Tongue protrudes centrally and palate elevates symmetrically. Tonsils are 1+. Neck size is 18 1/8 inches. John Rangel has a Absent overbite/minimal underbite. Nasal inspection reveals no significant nasal mucosal bogginess or redness and no septal deviation.   Chest: Clear to auscultation without wheezing, rhonchi or crackles noted.  Heart: S1+S2+0, regular and normal without murmurs, rubs or gallops noted.   Abdomen: Soft, non-tender and non-distended with normal bowel sounds appreciated on auscultation.  Extremities: There is no pitting edema in the distal lower extremities bilaterally.  Skin: Warm and dry without trophic changes noted. There are no varicose veins.  Musculoskeletal: exam reveals no obvious joint deformities, tenderness or joint swelling or erythema.   Neurologically:  Mental status: The patient is awake, alert and oriented in all 4 spheres. John Rangel immediate and remote memory, attention, language skills and fund of knowledge are appropriate. There is no evidence of aphasia, agnosia, apraxia or anomia. Speech is clear with normal prosody and enunciation. Thought process is linear. Mood is normal and affect is normal.  Cranial nerves II - XII are as described above under HEENT exam. In addition: shoulder shrug is normal with equal shoulder height noted. Motor exam: Normal bulk, strength and tone is noted. There is no drift, tremor or rebound. Romberg is negative. Reflexes are 2+ throughout. Fine motor skills and coordination: intact with normal finger taps, normal hand movements, normal rapid alternating patting, normal foot taps and normal foot  agility.  Cerebellar testing: No dysmetria or intention tremor on finger to nose testing. Heel to shin is unremarkable bilaterally. There is no truncal or gait ataxia.  Sensory exam: intact to light touch in the upper and lower extremities.  Gait, station and balance: John Rangel stands easily. No veering to one side is noted. No leaning to one side is noted. Posture is age-appropriate and stance is narrow based. Gait shows normal stride length and normal pace. No problems turning  are noted. Tandem walk is unremarkable.            Assessment and Plan:   In summary, JENARO SOUDER is a very pleasant 46 year old male with an underlying medical history of allergic rhinitis, asthma, reflux disease, hypogonadism, and obesity, who presents for follow-up consultation of John Rangel obstructive sleep apnea, on treatment with CPAP at a pressure of 9 cm. John Rangel fulfilled compliance criteria in the early November through December 2017 timeframe but had some lapses and treatment and more recently the flu. Physical exam is stable. John Rangel has lost a little bit of weight. We talked about John Rangel split-night sleep study results from October 2017 in detail today. Findings were explained, we also reviewed John Rangel compliance data from the early November time frame as well as more recently. John Rangel has decreased John Rangel average usage time as well, sometimes John Rangel is bothered by the mask. John Rangel is encouraged to be fully compliant with treatment due to the severity of John Rangel obstructive sleep apnea. John Rangel is encouraged to try to lose weight. I again explained the risks and ramifications of untreated OSA, in particular with respect to developing cardiovascular disease down the Road. John Rangel is encouraged to strive for weight loss, exercise regularly and be well hydrated with water. John Rangel sleep study showed PLMS, John Rangel reports no symptoms of restless leg syndrome and has intermittent restlessness secondary to joint pain, particularly right knee pain.  I explained the importance of being compliant  with PAP treatment, not only for insurance purposes but primarily to improve John Rangel symptoms, and for the patient's long term health benefit, including to reduce John Rangel cardiovascular risks.I suggested a six-month checkup, John Rangel can see one of our nurse practitioners at the time, I will see him back after that. I answered all John Rangel questions today and John Rangel was in agreement.   I answered all John Rangel questions today and the patient was in agreement.   I spent 25 minutes in total face-to-face time with the patient, more than 50% of which was spent in counseling and coordination of care, reviewing test results, reviewing medication and discussing or reviewing the diagnosis of OSA, its prognosis and treatment options.

## 2017-03-12 DIAGNOSIS — G4733 Obstructive sleep apnea (adult) (pediatric): Secondary | ICD-10-CM | POA: Diagnosis not present

## 2017-03-20 DIAGNOSIS — T1501XA Foreign body in cornea, right eye, initial encounter: Secondary | ICD-10-CM | POA: Diagnosis not present

## 2017-04-12 DIAGNOSIS — G4733 Obstructive sleep apnea (adult) (pediatric): Secondary | ICD-10-CM | POA: Diagnosis not present

## 2017-07-08 ENCOUNTER — Encounter: Payer: Self-pay | Admitting: Adult Health

## 2017-07-08 ENCOUNTER — Ambulatory Visit (INDEPENDENT_AMBULATORY_CARE_PROVIDER_SITE_OTHER): Payer: BLUE CROSS/BLUE SHIELD | Admitting: Adult Health

## 2017-07-08 VITALS — BP 116/77 | HR 79 | Ht 76.0 in | Wt 269.0 lb

## 2017-07-08 DIAGNOSIS — G4733 Obstructive sleep apnea (adult) (pediatric): Secondary | ICD-10-CM

## 2017-07-08 DIAGNOSIS — Z9989 Dependence on other enabling machines and devices: Secondary | ICD-10-CM

## 2017-07-08 NOTE — Progress Notes (Addendum)
PATIENT: John Rangel DOB: [birth year]  REASON FOR VISIT: follow up- OSA on CPAP HISTORY FROM: patient  HISTORY OF PRESENT ILLNESS: Today 07/08/17   [name] is a 46 year old male with a history of obstructive sleep apnea on CPAP. He returns today for a compliance download. His download indicates that he uses machine 20 out of 30 days for compliance of 67%. He uses machine greater than 4 hours 15 out of 30 days for compliance of 50%. On average he uses his machine 5 hours and 53 minutes. His pressure is set at 9 cm of water with residual AHI of 1. He does not have a significant leak. The patient states that his use of the machine has decreased his wife libido. He states theregore some nights he does not put it on in order to be intimate with his wife. He does report that he understands the importance of using the machine. He is trying to get used to using it. He returns today for an evaluation.    HISTORY Copied from Dr. Guadelupe Sabin notes: Mr. John Rangel is a 46 year old right-handed gentleman with an underlying medical history of allergic rhinitis, asthma, reflux disease, hypogonadism, and obesity, s/p arthroscopic knee surgeries, back pain, who presents for follow-up consultation of his sleep disorder, after his recent split-night sleep study. The patient is unaccompanied today. I first met him on 09/17/2016, at the request of his primary care physician, at which time he reported snoring and excessive daytime somnolence as well as witnessed apneic pauses while asleep. I invited him for a sleep study. He had a split-night sleep study on 10/09/2016. I went over his test results with him in detail today. Baseline sleep efficiency was 73.2%, he had moderate sleep fragmentation and an increased percentage of stage I and stage II sleep, a normal percentage of slow-wave sleep and decreased percentage of REM sleep. He had a normal REM latency. Apnea hypopneas index was 54.1 per hour, rising to 75 per hour  during REM sleep and 95.4 per hour in the supine position. He had mild to moderate snoring, EKG was in keeping with normal sinus rhythm. Average oxygen saturation was 96%, nadir was 73%, time below 89% saturation was 57 minutes. He had no significant PLMS. He was then started on CPAP with a pressure of 5 cm. He was titrated to 9 cm. AHI was 0 per hour with brief supine REM sleep achieved an L2 nadir of 93%. He had an increased percentage of REM sleep and increased percentage of slow-wave sleep during the treatment portion of the study. Average oxygen saturation was 96%, nadir was 80%. He had severe PLMS with minimal arousals during the second part of the study. Based on his test results I prescribed CPAP therapy for home use.   01/08/2017: I reviewed his CPAP compliance data from 12/08/2016 through 01/06/2017, which is a total of 30 days, during which time he used his CPAP only 18 days with percent used days greater than 4 hours at 43%, indicating suboptimal compliance with an average usage of 4 hours and 55 minutes, residual AHI 1.7 per hour, leak low with the 95th percentile at 8.5 L/m on a pressure of 9 cm with EPR of 3. I reviewed his compliance data from 10/22/2016 through 01/06/2017 which is a total of 77 days, during which time his compliance percentage was 61%, again suboptimal. However, he fullfilled compliance criteria in the first month of treatment: From 10/26/2016 through 11/24/2016, which is a total of 30 days  he was 97% compliant with usage and 80% compliance with percent used days greater than 4 hours.   01/08/2017: He reports that he is doing better with CPAP therapy. He had some lapses and treatment because of her recent acute illness, he had the flu, his daughter had it first, then he and his wife. Weight has been stable, has lost a few lb d/t sickness. Sleeps okay, sleep overall a little better since the CPAP, better rested. Denies RLS symptoms, has occasional knee pain, R>L. Restless  sleeper still, some LBP. Not using his testosterone as regularly. Did not like the silicone based full facemask and switch to the air touch full facemask   REVIEW OF SYSTEMS: Out of a complete 14 system review of symptoms, the patient complains only of the following symptoms, and all other reviewed systems are negative.  Eye discharge, eye itching  ALLERGIES: No Known Allergies  HOME MEDICATIONS: Outpatient Medications Prior to Visit  Medication Sig Dispense Refill  . cyclobenzaprine (FLEXERIL) 10 MG tablet Take 10 mg by mouth 3 (three) times daily as needed for muscle spasms.    . fluocinolone (VANOS) 0.01 % cream Apply topically as needed.    Marland Kitchen ibuprofen (ADVIL,MOTRIN) 800 MG tablet Take 800 mg by mouth every 8 (eight) hours as needed.    Marland Kitchen ketotifen (ALAWAY) 0.025 % ophthalmic solution 1 drop 2 (two) times daily.    . TESTIM 50 MG/5GM GEL As directed     No facility-administered medications prior to visit.     PAST MEDICAL HISTORY: Past Medical History:  Diagnosis Date  . Anal fissure   . AR (allergic rhinitis)   . Asthma   . ED (erectile dysfunction)   . GERD (gastroesophageal reflux disease)   . Hypogonadism male   . Impaired fasting glucose   . Intertriginous candidiasis   . Metabolic syndrome   . Metabolic syndrome X   . Obesity   . OSA (obstructive sleep apnea)   . Pilonidal cyst     PAST SURGICAL HISTORY: Past Surgical History:  Procedure Laterality Date  . KNEE ARTHROSCOPY Left 2004  . KNEE ARTHROSCOPY Right 08/02/2015   Procedure: KNEE ARTHROSCOPY WITH REMOVAL LOOSE BODIES AND DEBRIDEMENT CHONDROMALACIA;  Surgeon: Frederik Pear, MD;  Location: [facility];  Service: Orthopedics;  Laterality: Right;  . LASIK    . VASECTOMY      FAMILY HISTORY: Family History  Problem Relation Age of Onset  . Hypertension Father   . Diabetes Mellitus II Father   . Hyperlipidemia Father   . Thyroid disease Father   . Colon polyps Father   . Thyroid disease  Mother   . Colon polyps Mother   . COPD Paternal Grandmother   . Heart attack Paternal Grandfather   . Colon cancer Paternal Aunt 25    SOCIAL HISTORY: Social History   Social History  . Marital status: Married    Spouse name: Mateo Flow   . Number of children: 3  . Years of education: Assoc   Occupational History  . Information systems manager    Social History Main Topics  . Smoking status: Never Smoker  . Smokeless tobacco: Never Used  . Alcohol use 0.6 oz/week    1 Cans of beer per week     Comment: social  . Drug use: No  . Sexual activity: Yes   Other Topics Concern  . Not on file   Social History Narrative   Daily caffeine 5-6 12oz drinks  PHYSICAL EXAM  Vitals:   07/08/17 0746  BP: 116/77  Pulse: 79  Weight: 269 lb (122 kg)  Height: _0  (1.93 m)   Body mass index is 32.74 kg/m.  Generalized: Well developed, in no acute distress   Neurological examination  Mentation: Alert oriented to time, place, history taking. Follows all commands speech and language fluent Cranial nerve II-XII: Pupils were equal round reactive to light. Extraocular movements were full, visual field were full on confrontational test. Facial sensation and strength were normal. Uvula tongue midline. Head turning and shoulder shrug  were normal and symmetric. Motor: The motor testing reveals 5 over 5 strength of all 4 extremities. Good symmetric motor tone is noted throughout.  Sensory: Sensory testing is intact to soft touch on all 4 extremities. No evidence of extinction is noted.  Coordination: Cerebellar testing reveals good finger-nose-finger and heel-to-shin bilaterally.  Gait and station: Gait is normal.   DIAGNOSTIC DATA (LABS, IMAGING, TESTING) - I reviewed patient records, labs, notes, testing and imaging myself where available.     ASSESSMENT AND PLAN 46 y.o. year old male  has a past medical history of Anal fissure; AR (allergic rhinitis); Asthma; ED (erectile  dysfunction); GERD (gastroesophageal reflux disease); Hypogonadism male; Impaired fasting glucose; Intertriginous candidiasis; Metabolic syndrome; Metabolic syndrome X; Obesity; OSA (obstructive sleep apnea); and Pilonidal cyst. here with:  1. Obstructive sleep apnea on CPAP  The patient is encouraged to use his CPAP nightly and for greater than 4 hours each night. I again explained the importance of using the CPAP and the risk associated with untreated sleep apnea. The patient verbalized understanding. He will follow-up in 6 months or sooner if needed.  I spent 15 minutes with the patient. 50% of this time was spent reviewing the patient's CPAP download and risk factors associated with untreated sleep apnea.     Ward Givens, MSN, NP-C 07/08/2017, 7:56 AM [facility] [address] [place], [place] [address] [phone]  I reviewed the above note and documentation by the Nurse Practitioner and agree with the history, physical exam, assessment and plan as outlined above. I was immediately available for face-to-face consultation. I particularly agree with the reminder to patient that he has SEVERE OSA and that treatment is highly recommended and may have long term health benefits.   Star Age, MD, PhD [facility])

## 2017-07-08 NOTE — Patient Instructions (Signed)
Your Plan:  Try to use CPAP nightly >4 hours each night If your symptoms worsen or you develop new symptoms please let us know.   Thank you for coming to see us at [facility]. I hope we have been able to provide you high quality care today.  You may receive a patient satisfaction survey over the next few weeks. We would appreciate your feedback and comments so that we may continue to improve ourselves and the health of our patients.  

## 2017-07-24 DIAGNOSIS — Z6833 Body mass index (BMI) 33.0-33.9, adult: Secondary | ICD-10-CM | POA: Diagnosis not present

## 2017-07-24 DIAGNOSIS — M722 Plantar fascial fibromatosis: Secondary | ICD-10-CM | POA: Diagnosis not present

## 2017-08-11 ENCOUNTER — Ambulatory Visit (INDEPENDENT_AMBULATORY_CARE_PROVIDER_SITE_OTHER): Payer: BLUE CROSS/BLUE SHIELD

## 2017-08-11 ENCOUNTER — Ambulatory Visit (INDEPENDENT_AMBULATORY_CARE_PROVIDER_SITE_OTHER): Payer: BLUE CROSS/BLUE SHIELD | Admitting: Podiatry

## 2017-08-11 DIAGNOSIS — M722 Plantar fascial fibromatosis: Secondary | ICD-10-CM

## 2017-08-11 MED ORDER — BETAMETHASONE SOD PHOS & ACET 6 (3-3) MG/ML IJ SUSP: 3.0000 mg | Freq: Once | INTRAMUSCULAR | Status: AC

## 2017-08-11 NOTE — Progress Notes (Signed)
   Subjective: Patient presents today for pain and tenderness in the left foot. Patient states the foot pain has been hurting for several weeks now. Patient states that it hurts in the mornings with the first steps out of bed. Patient presents today for further treatment and evaluation  Objective: Physical Exam General: The patient is alert and oriented x3 in no acute distress.  Dermatology: Skin is warm, dry and supple bilateral lower extremities. Negative for open lesions or macerations bilateral.   Vascular: Dorsalis Pedis and Posterior Tibial pulses palpable bilateral.  Capillary fill time is immediate to all digits.  Neurological: Epicritic and protective threshold intact bilateral.   Musculoskeletal: Tenderness to palpation at the medial calcaneal tubercale and through the insertion of the plantar fascia of the left foot. All other joints range of motion within normal limits bilateral. Strength 5/5 in all groups bilateral.   Radiographic exam:   Normal osseous mineralization. Joint spaces preserved. No fracture/dislocation/boney destruction. Calcaneal spur present with mild thickening of plantar fascia left. No other soft tissue abnormalities or radiopaque foreign bodies.   Assessment: 1. Plantar fasciitis left foot  Plan of Care:  1. Patient evaluated. Xrays reviewed.   2. Injection of 0.5cc Celestone soluspan injected into the left plantar fascia.  3. Patient currently has a prescription for meloxicam. Resume meloxicam once head cold is resolved. 4. Plantar fascial band(s) dispensed  5. Instructed patient regarding therapies and modalities at home to alleviate symptoms.  6. Return to clinic in 4 weeks.     Felecia Shelling, DPM [facility]  Dr. Felecia Shelling, DPM    [address] [place], [place] [address]                Office [phone]  Fax [phone]

## 2017-08-12 DIAGNOSIS — G4733 Obstructive sleep apnea (adult) (pediatric): Secondary | ICD-10-CM | POA: Diagnosis not present

## 2017-08-22 DIAGNOSIS — G4733 Obstructive sleep apnea (adult) (pediatric): Secondary | ICD-10-CM | POA: Diagnosis not present

## 2017-08-26 DIAGNOSIS — H5211 Myopia, right eye: Secondary | ICD-10-CM | POA: Diagnosis not present

## 2017-09-08 ENCOUNTER — Ambulatory Visit (INDEPENDENT_AMBULATORY_CARE_PROVIDER_SITE_OTHER): Payer: BLUE CROSS/BLUE SHIELD | Admitting: Podiatry

## 2017-09-08 ENCOUNTER — Encounter: Payer: Self-pay | Admitting: Podiatry

## 2017-09-08 DIAGNOSIS — M722 Plantar fascial fibromatosis: Secondary | ICD-10-CM

## 2017-09-08 MED ORDER — BETAMETHASONE SOD PHOS & ACET 6 (3-3) MG/ML IJ SUSP: 3.0000 mg | Freq: Once | INTRAMUSCULAR | Status: AC

## 2017-09-08 NOTE — Progress Notes (Signed)
   Subjective: Patient presents today for follow-up evaluation of left plantar fasciitis. He states he is doing somewhat better. He states his fascial brace is worn out. He states the injection he received an the last visit helped alleviate the pain for approximately 1.5 weeks. Patient presents today for further treatment and evaluation.  Objective: Physical Exam General: The patient is alert and oriented x3 in no acute distress.  Dermatology: Skin is warm, dry and supple bilateral lower extremities. Negative for open lesions or macerations bilateral.   Vascular: Dorsalis Pedis and Posterior Tibial pulses palpable bilateral.  Capillary fill time is immediate to all digits.  Neurological: Epicritic and protective threshold intact bilateral.   Musculoskeletal: Tenderness to palpation at the medial calcaneal tubercale and through the insertion of the plantar fascia of the left foot. All other joints range of motion within normal limits bilateral. Strength 5/5 in all groups bilateral.    Assessment: 1. Plantar fasciitis left foot  Plan of Care:  1. Patient evaluated.  2. Injection of 0.5cc Celestone soluspan injected into the left plantar fascia.  3. Continue taking meloxicam and wearing fascial brace. 4. Continue stretching. 5. Return to clinic in 4 weeks. If not better, patient will need custom molded orthotics.     Felecia Shelling, DPM [facility]  Dr. Felecia Shelling, DPM    [address] [place], [place] [address]                Office [phone]  Fax [phone]

## 2017-09-17 ENCOUNTER — Ambulatory Visit (INDEPENDENT_AMBULATORY_CARE_PROVIDER_SITE_OTHER): Payer: BLUE CROSS/BLUE SHIELD | Admitting: Podiatry

## 2017-09-17 DIAGNOSIS — S93692A Other sprain of left foot, initial encounter: Secondary | ICD-10-CM

## 2017-09-17 NOTE — Progress Notes (Signed)
   HPI: 46 year old male presents the office today for a new complaint regarding an injury he sustained approximately 1 week ago. Patient states that he was stepping over a dog gate in his home and his he stepped on his left foot he heard an audible pop and severe pain and was immediately unable to bear his foot. Pain is directly on the bottom of the arch of the foot. Patient is being treated currently for plantar fasciitis to the left foot. He presents today for further treatment and evaluation  Past Medical History:  Diagnosis Date  . Anal fissure   . AR (allergic rhinitis)   . Asthma   . ED (erectile dysfunction)   . GERD (gastroesophageal reflux disease)   . Hypogonadism male   . Impaired fasting glucose   . Intertriginous candidiasis   . Metabolic syndrome   . Metabolic syndrome X   . Obesity   . OSA (obstructive sleep apnea)   . Pilonidal cyst      Physical Exam: General: The patient is alert and oriented x3 in no acute distress.  Dermatology: Skin is warm, dry and supple bilateral lower extremities. Negative for open lesions or macerations.  Vascular: Palpable pedal pulses bilaterally. No edema or erythema noted. Capillary refill within normal limits.  Neurological: Epicritic and protective threshold grossly intact bilaterally.   Musculoskeletal Exam: moderate pain on palpation noted to the left forefoot distal aspect of the plantar arch. The plantar fascia does not appear to be near as tight as the contralateral limb suggestive of a plantar fascial ruptureRange of motion within normal limits to all pedal and ankle joints bilateral. Muscle strength 5/5 in all groups bilateral.    Assessment: -  Likely plantar fascial rupture left foot   Plan of Care:  - patient was evaluated today - I explained to patient that he likely ruptured his plantar fascia which may not be of negative impact due to chronic plantar fasciitis - immobilization cam boot dispensed today. Weightbearing  as tolerated 3 weeks -  Dispensed today to wear at nighttime - prescription for Duexis sent to [facility] - Return to clinic in 3 weeks   Felecia Shelling, DPM [facility]  Dr. Felecia Shelling, DPM    [address] [place], [place] [address]                Office [phone]  Fax [phone]

## 2017-09-21 DIAGNOSIS — G4733 Obstructive sleep apnea (adult) (pediatric): Secondary | ICD-10-CM | POA: Diagnosis not present

## 2017-09-30 DIAGNOSIS — R7301 Impaired fasting glucose: Secondary | ICD-10-CM | POA: Diagnosis not present

## 2017-09-30 DIAGNOSIS — E291 Testicular hypofunction: Secondary | ICD-10-CM | POA: Diagnosis not present

## 2017-10-01 DIAGNOSIS — E8881 Metabolic syndrome: Secondary | ICD-10-CM | POA: Diagnosis not present

## 2017-10-01 DIAGNOSIS — Z0000 Encounter for general adult medical examination without abnormal findings: Secondary | ICD-10-CM | POA: Diagnosis not present

## 2017-10-01 DIAGNOSIS — E298 Other testicular dysfunction: Secondary | ICD-10-CM | POA: Diagnosis not present

## 2017-10-01 DIAGNOSIS — Z23 Encounter for immunization: Secondary | ICD-10-CM | POA: Diagnosis not present

## 2017-10-01 DIAGNOSIS — Z1389 Encounter for screening for other disorder: Secondary | ICD-10-CM | POA: Diagnosis not present

## 2017-10-01 DIAGNOSIS — R7301 Impaired fasting glucose: Secondary | ICD-10-CM | POA: Diagnosis not present

## 2017-10-01 DIAGNOSIS — M66872 Spontaneous rupture of other tendons, left ankle and foot: Secondary | ICD-10-CM | POA: Diagnosis not present

## 2017-10-06 ENCOUNTER — Ambulatory Visit (INDEPENDENT_AMBULATORY_CARE_PROVIDER_SITE_OTHER): Payer: BLUE CROSS/BLUE SHIELD | Admitting: Podiatry

## 2017-10-06 DIAGNOSIS — S93692D Other sprain of left foot, subsequent encounter: Secondary | ICD-10-CM

## 2017-10-08 NOTE — Progress Notes (Signed)
   HPI: [id number] year old male presents the office today for follow up evaluation of left plantar fascial rupture. He denies any pain at this time. He does report intermittently feeling like there is something in the forefoot when standing barefoot. Wearing the night splint and CAM boot help alleviate his pain. He presents today for further treatment and evaluation.   Past Medical History:  Diagnosis Date  . Anal fissure   . AR (allergic rhinitis)   . Asthma   . ED (erectile dysfunction)   . GERD (gastroesophageal reflux disease)   . Hypogonadism male   . Impaired fasting glucose   . Intertriginous candidiasis   . Metabolic syndrome   . Metabolic syndrome X   . Obesity   . OSA (obstructive sleep apnea)   . Pilonidal cyst      Physical Exam: General: The patient is alert and oriented x3 in no acute distress.  Dermatology: Skin is warm, dry and supple bilateral lower extremities. Negative for open lesions or macerations.  Vascular: Palpable pedal pulses bilaterally. No edema or erythema noted. Capillary refill within normal limits.  Neurological: Epicritic and protective threshold grossly intact bilaterally.   Musculoskeletal Exam: moderate pain on palpation noted to the left forefoot distal aspect of the plantar arch. The plantar fascia does not appear to be near as tight as the contralateral limb suggestive of a plantar fascial ruptureRange of motion within normal limits to all pedal and ankle joints bilateral. Muscle strength 5/5 in all groups bilateral.    Assessment: -  plantar fascial rupture left foot   Plan of Care:  - Patient was evaluated today. - Discontinue wearing CAM boot. Resume wearing good shoe gear. - Continue taking Duexis as needed.  - Return to clinic when necessary.    Felecia ShellingBrent M. Venia Riveron, DPM [facility]  Dr. Felecia ShellingBrent M. Dushaun Okey, DPM    [address]                                        [place], [place] [id number]                Office [phone]  [phone]  Fax [phone]) [phone]

## 2017-10-22 DIAGNOSIS — G4733 Obstructive sleep apnea (adult) (pediatric): Secondary | ICD-10-CM | POA: Diagnosis not present

## 2017-11-21 DIAGNOSIS — G4733 Obstructive sleep apnea (adult) (pediatric): Secondary | ICD-10-CM | POA: Diagnosis not present

## 2017-12-22 DIAGNOSIS — G4733 Obstructive sleep apnea (adult) (pediatric): Secondary | ICD-10-CM | POA: Diagnosis not present

## 2018-01-08 ENCOUNTER — Ambulatory Visit: Payer: BLUE CROSS/BLUE SHIELD | Admitting: Adult Health

## 2018-01-08 ENCOUNTER — Encounter: Payer: Self-pay | Admitting: Adult Health

## 2018-01-08 VITALS — BP 119/71 | HR 74 | Ht 76.0 in | Wt 277.4 lb

## 2018-01-08 DIAGNOSIS — G4733 Obstructive sleep apnea (adult) (pediatric): Secondary | ICD-10-CM

## 2018-01-08 DIAGNOSIS — Z9989 Dependence on other enabling machines and devices: Secondary | ICD-10-CM | POA: Diagnosis not present

## 2018-01-08 NOTE — Progress Notes (Addendum)
PATIENT: John Rangel DOB: March 12, 1971  REASON FOR VISIT: follow up HISTORY FROM: patient  HISTORY OF PRESENT ILLNESS: Today 01/08/18 John Rangel is a 47 year old male with a history of obstructive sleep apnea on CPAP.  He returns today for compliance download.  His download indicates that he use his machine 48 out of 90 days for compliance of 53%.  He uses machine greater than 4 hours 42 out of 90 days for compliance of 47%.  On average he uses his machine 5 hours and 48 minutes.  His residual AHI is 2.  He is on a pressure of 9 centimeters of water with EPR of 3.  He does not have a significant leak.  He reports that he continues to have trouble using machine nightly.  He reports that there are some nights he falls asleep watching TV and forgets to put the mask on.  He reports that there are still times that he does not use it because of intimacy issues.  He states that it does decrease his wife's libido.  He denies any new neurological symptoms.  He returns today for an evaluation.   HISTORY John Rangel is a 47 year old male with a history of obstructive sleep apnea on CPAP. He returns today for a compliance download. His download indicates that he uses machine 20 out of 30 days for compliance of 67%. He uses machine greater than 4 hours 15 out of 30 days for compliance of 50%. On average he uses his machine 5 hours and 53 minutes. His pressure is set at 9 cm of water with residual AHI of 1. He does not have a significant leak. The patient states that his use of the machine has decreased his wife libido. He states theregore some nights he does not put it on in order to be intimate with his wife. He does report that he understands the importance of using the machine. He is trying to get used to using it. He returns today for an evaluation.  REVIEW OF SYSTEMS: Out of a complete 14 system review of symptoms, the patient complains only of the following symptoms, and all other reviewed systems are  negative.  See HPI  ALLERGIES: No Known Allergies  HOME MEDICATIONS: Outpatient Medications Prior to Visit  Medication Sig Dispense Refill  . cyclobenzaprine (FLEXERIL) 10 MG tablet Take 10 mg by mouth 3 (three) times daily as needed for muscle spasms.    . fluocinolone (VANOS) 0.01 % cream Apply topically as needed.    Marland Kitchen ibuprofen (ADVIL,MOTRIN) 800 MG tablet Take 800 mg by mouth every 8 (eight) hours as needed.    . Ibuprofen-Famotidine 800-26.6 MG TABS Take by mouth. Taking daily prn (ordered as TID)    . ketotifen (ALAWAY) 0.025 % ophthalmic solution 1 drop 2 (two) times daily.    Marland Kitchen omeprazole (PRILOSEC) 40 MG capsule Take 40 mg by mouth daily.    . TESTIM 50 MG/5GM GEL As directed     Facility-Administered Medications Prior to Visit  Medication Dose Route Frequency Provider Last Rate Last Dose  . betamethasone acetate-betamethasone sodium phosphate (CELESTONE) injection 3 mg  3 mg Intramuscular Once Gala Lewandowsky M, DPM      . betamethasone acetate-betamethasone sodium phosphate (CELESTONE) injection 3 mg  3 mg Intramuscular Once Felecia Shelling, DPM        PAST MEDICAL HISTORY: Past Medical History:  Diagnosis Date  . Anal fissure   . AR (allergic rhinitis)   . Asthma   .  ED (erectile dysfunction)   . GERD (gastroesophageal reflux disease)   . Hypogonadism male   . Impaired fasting glucose   . Intertriginous candidiasis   . Metabolic syndrome   . Metabolic syndrome X   . Obesity   . OSA (obstructive sleep apnea)   . Pilonidal cyst   . Rupture of plantaris tendon     PAST SURGICAL HISTORY: Past Surgical History:  Procedure Laterality Date  . KNEE ARTHROSCOPY Left 2004  . KNEE ARTHROSCOPY Right 08/02/2015   Procedure: KNEE ARTHROSCOPY WITH REMOVAL LOOSE BODIES AND DEBRIDEMENT CHONDROMALACIA;  Surgeon: Gean BirchwoodFrank Rowan, MD;  Location: [facility];  Service: Orthopedics;  Laterality: Right;  . LASIK    . VASECTOMY      FAMILY HISTORY: Family History    Problem Relation Age of Onset  . Hypertension Father   . Diabetes Mellitus II Father   . Hyperlipidemia Father   . Thyroid disease Father   . Colon polyps Father   . Thyroid disease Mother   . Colon polyps Mother   . COPD Paternal Grandmother   . Heart attack Paternal Grandfather   . Colon cancer Paternal Aunt 30    SOCIAL HISTORY: Social History   Socioeconomic History  . Marital status: Married    Spouse name: Vikki PortsValerie   . Number of children: 3  . Years of education: Assoc  . Highest education level: Not on file  Social Needs  . Financial resource strain: Not on file  . Food insecurity - worry: Not on file  . Food insecurity - inability: Not on file  . Transportation needs - medical: Not on file  . Transportation needs - non-medical: Not on file  Occupational History  . Occupation: Passenger transport managerdairy equipment sales  Tobacco Use  . Smoking status: Never Smoker  . Smokeless tobacco: Never Used  Substance and Sexual Activity  . Alcohol use: Yes    Alcohol/week: 0.6 oz    Types: 1 Cans of beer per week    Comment: social  . Drug use: No  . Sexual activity: Yes  Other Topics Concern  . Not on file  Social History Narrative   Daily caffeine 5-6 12oz drinks       PHYSICAL EXAM  Vitals:   01/08/18 0842  BP: 119/71  Pulse: 74  Weight: 277 lb 6.4 oz (125.8 kg)  Height: 6\' 4"  (1.93 m)   Body mass index is 33.77 kg/m.  Generalized: Well developed, in no acute distress   Neurological examination  Mentation: Alert oriented to time, place, history taking. Follows all commands speech and language fluent Cranial nerve II-XII: Pupils were equal round reactive to light. Extraocular movements were full, visual field were full on confrontational test. Facial sensation and strength were normal. Uvula tongue midline. Head turning and shoulder shrug  were normal and symmetric. Motor: The motor testing reveals 5 over 5 strength of all 4 extremities. Good symmetric motor tone is noted  throughout.  Sensory: Sensory testing is intact to soft touch on all 4 extremities. No evidence of extinction is noted.  Coordination: Cerebellar testing reveals good finger-nose-finger and heel-to-shin bilaterally.  Gait and station: Gait is normal. Tandem gait is normal. Romberg is negative. No drift is seen.  Reflexes: Deep tendon reflexes are symmetric and normal bilaterally.   DIAGNOSTIC DATA (LABS, IMAGING, TESTING) - I reviewed patient records, labs, notes, testing and imaging myself where available.  Lab Results  Component Value Date   HGB 17.3 (H) 08/02/2015  ASSESSMENT AND PLAN 47 y.o. year old male  has a past medical history of Anal fissure, AR (allergic rhinitis), Asthma, ED (erectile dysfunction), GERD (gastroesophageal reflux disease), Hypogonadism male, Impaired fasting glucose, Intertriginous candidiasis, Metabolic syndrome, Metabolic syndrome X, Obesity, OSA (obstructive sleep apnea), Pilonidal cyst, and Rupture of plantaris tendon. here with:  1.  Obstructive sleep apnea on CPAP  The patient CPAP download shows suboptimal compliance.  He continues to have issues using the machine nightly due to many factors.  I have encouraged the patient to try to use the machine every night.  I again reviewed risk factors associated with untreated sleep apnea.  He voiced understanding.  He will follow-up in 6 months or sooner if needed.   I spent 15 minutes with the patient. 50% of this time was spent reviewing CPAP download   Butch Penny, MSN, NP-C 01/08/2018, 9:08 AM [facility] [address], [address] [place], [place] [address] [phone]  I reviewed the above note and documentation by the Nurse Practitioner and agree with the history, physical exam, assessment and plan as outlined above. I was immediately available for face-to-face consultation. Huston Foley, MD, PhD [facility])

## 2018-01-08 NOTE — Patient Instructions (Signed)
Your Plan:  Try to use CPAP nightly If your symptoms worsen or you develop new symptoms please let us know.   [facility]. I hope we have been able to provide you high quality care today.  You may receive a patient satisfaction survey over the next few weeks. We would appreciate your feedback and comments so that we may continue to improve ourselves and the health of our patients.

## 2018-01-22 DIAGNOSIS — G4733 Obstructive sleep apnea (adult) (pediatric): Secondary | ICD-10-CM | POA: Diagnosis not present

## 2018-02-19 DIAGNOSIS — G4733 Obstructive sleep apnea (adult) (pediatric): Secondary | ICD-10-CM | POA: Diagnosis not present

## 2018-07-13 ENCOUNTER — Ambulatory Visit: Payer: BLUE CROSS/BLUE SHIELD | Admitting: Adult Health

## 2018-08-25 ENCOUNTER — Encounter: Payer: Self-pay | Admitting: Adult Health

## 2018-08-26 ENCOUNTER — Other Ambulatory Visit: Payer: Self-pay

## 2018-08-26 ENCOUNTER — Ambulatory Visit: Payer: BLUE CROSS/BLUE SHIELD | Admitting: Adult Health

## 2018-08-26 ENCOUNTER — Encounter: Payer: Self-pay | Admitting: Adult Health

## 2018-08-26 VITALS — BP 126/91 | HR 74 | Resp 16 | Ht 76.0 in | Wt 276.5 lb

## 2018-08-26 DIAGNOSIS — Z9989 Dependence on other enabling machines and devices: Secondary | ICD-10-CM | POA: Diagnosis not present

## 2018-08-26 DIAGNOSIS — G4733 Obstructive sleep apnea (adult) (pediatric): Secondary | ICD-10-CM

## 2018-08-26 NOTE — Progress Notes (Addendum)
PATIENT: John Rangel DOB: [birth year]  REASON FOR VISIT: follow up HISTORY FROM: patient  HISTORY OF PRESENT ILLNESS: Today 08/26/18:  John Rangel is a 47 year old male with a history of obstructive sleep apnea on CPAP.  His CPAP download indicates that he uses machine 18 out of 30 days for compliance of 53%. he used his machine greater than 4 hours each night that he uses machine.  On average he uses his machine 6 hours and 46 minutes.  His residual AHI is 1.2 on 9 cm of water with EPR 3.  He does not have a significant leak.  The patient states that he is unable to use the machine nightly due to intimacy issues as well as allergies.  He returns today for evaluation.  HISTORY 01/08/18 John Rangel is a 47 year old male with a history of obstructive sleep apnea on CPAP.  He returns today for compliance download.  His download indicates that he use his machine 48 out of 90 days for compliance of 53%.  He uses machine greater than 4 hours 42 out of 90 days for compliance of 47%.  On average he uses his machine 5 hours and 48 minutes.  His residual AHI is 2.  He is on a pressure of 9 centimeters of water with EPR of 3.  He does not have a significant leak.  He reports that he continues to have trouble using machine nightly.  He reports that there are some nights he falls asleep watching TV and forgets to put the mask on.  He reports that there are still times that he does not use it because of intimacy issues.  He states that it does decrease his wife's libido.  He denies any new neurological symptoms.  He returns today for an evaluation.  REVIEW OF SYSTEMS: Out of a complete 14 system review of symptoms, the patient complains only of the following symptoms, and all other reviewed systems are negative.  See HPI  ALLERGIES: No Known Allergies  HOME MEDICATIONS: Outpatient Medications Prior to Visit  Medication Sig Dispense Refill  . cyclobenzaprine (FLEXERIL) 10 MG tablet Take 10 mg by mouth 3  (three) times daily as needed for muscle spasms.    . fluocinolone (VANOS) 0.01 % cream Apply topically as needed.    . fluticasone (FLONASE) 50 MCG/ACT nasal spray Place into both nostrils daily.    Marland Kitchen ibuprofen (ADVIL,MOTRIN) 800 MG tablet Take 800 mg by mouth every 8 (eight) hours as needed.    Marland Kitchen ketotifen (ALAWAY) 0.025 % ophthalmic solution 1 drop 2 (two) times daily.    Marland Kitchen omeprazole-sodium bicarbonate (ZEGERID) 40-1100 MG capsule   10  . Testosterone 20.25 MG/ACT (1.62%) GEL   4  . Ibuprofen-Famotidine 800-26.6 MG TABS Take by mouth. Taking daily prn (ordered as TID)    . omeprazole (PRILOSEC) 40 MG capsule Take 40 mg by mouth daily.    . TESTIM 50 MG/5GM GEL As directed     Facility-Administered Medications Prior to Visit  Medication Dose Route Frequency Provider Last Rate Last Dose  . betamethasone acetate-betamethasone sodium phosphate (CELESTONE) injection 3 mg  3 mg Intramuscular Once Gala Lewandowsky M, DPM      . betamethasone acetate-betamethasone sodium phosphate (CELESTONE) injection 3 mg  3 mg Intramuscular Once Felecia Shelling, DPM        PAST MEDICAL HISTORY: Past Medical History:  Diagnosis Date  . Anal fissure   . AR (allergic rhinitis)   . Asthma   .  ED (erectile dysfunction)   . GERD (gastroesophageal reflux disease)   . Hypogonadism male   . Impaired fasting glucose   . Intertriginous candidiasis   . Metabolic syndrome   . Metabolic syndrome X   . Obesity   . OSA (obstructive sleep apnea)   . Pilonidal cyst   . Rupture of plantaris tendon     PAST SURGICAL HISTORY: Past Surgical History:  Procedure Laterality Date  . KNEE ARTHROSCOPY Left 2004  . KNEE ARTHROSCOPY Right 08/02/2015   Procedure: KNEE ARTHROSCOPY WITH REMOVAL LOOSE BODIES AND DEBRIDEMENT CHONDROMALACIA;  Surgeon: Gean Birchwood, MD;  Location: [facility];  Service: Orthopedics;  Laterality: Right;  . LASIK    . VASECTOMY      FAMILY HISTORY: Family History  Problem Relation Age  of Onset  . Hypertension Father   . Diabetes Mellitus II Father   . Hyperlipidemia Father   . Thyroid disease Father   . Colon polyps Father   . Thyroid disease Mother   . Colon polyps Mother   . COPD Paternal Grandmother   . Heart attack Paternal Grandfather   . Colon cancer Paternal Aunt 30    SOCIAL HISTORY: Social History   Socioeconomic History  . Marital status: Married    Spouse name: Vikki Ports   . Number of children: 3  . Years of education: Assoc  . Highest education level: Not on file  Occupational History  . Occupation: Passenger transport manager  Social Needs  . Financial resource strain: Not on file  . Food insecurity:    Worry: Not on file    Inability: Not on file  . Transportation needs:    Medical: Not on file    Non-medical: Not on file  Tobacco Use  . Smoking status: Never Smoker  . Smokeless tobacco: Never Used  Substance and Sexual Activity  . Alcohol use: Yes    Alcohol/week: 1.0 standard drinks    Types: 1 Cans of beer per week    Comment: social  . Drug use: No  . Sexual activity: Yes  Lifestyle  . Physical activity:    Days per week: Not on file    Minutes per session: Not on file  . Stress: Not on file  Relationships  . Social connections:    Talks on phone: Not on file    Gets together: Not on file    Attends religious service: Not on file    Active member of club or organization: Not on file    Attends meetings of clubs or organizations: Not on file    Relationship status: Not on file  . Intimate partner violence:    Fear of current or ex partner: Not on file    Emotionally abused: Not on file    Physically abused: Not on file    Forced sexual activity: Not on file  Other Topics Concern  . Not on file  Social History Narrative   Daily caffeine 5-6 12oz drinks       PHYSICAL EXAM  Vitals:   08/26/18 0853  BP: (!) 126/91  Pulse: 74  Resp: 16  Weight: 276 lb 8 oz (125.4 kg)  Height: 6\' 4"  (1.93 m)   Body mass index is  33.66 kg/m.  Generalized: Well developed, in no acute distress   Neurological examination  Mentation: Alert oriented to time, place, history taking. Follows all commands speech and language fluent Cranial nerve II-XII: Pupils were equal round reactive to light. Extraocular movements were  full, visual field were full on confrontational test. Facial sensation and strength were normal. Uvula tongue midline. Head turning and shoulder shrug  were normal and symmetric. Motor: The motor testing reveals 5 over 5 strength of all 4 extremities. Good symmetric motor tone is noted throughout.  Sensory: Sensory testing is intact to soft touch on all 4 extremities. No evidence of extinction is noted.  Coordination: Cerebellar testing reveals good finger-nose-finger and heel-to-shin bilaterally.  Gait and station: Gait is normal.  Reflexes: Deep tendon reflexes are symmetric and normal bilaterally.   DIAGNOSTIC DATA (LABS, IMAGING, TESTING) - I reviewed patient records, labs, notes, testing and imaging myself where available.  Lab Results  Component Value Date   HGB 17.3 (H) 08/02/2015      ASSESSMENT AND PLAN 47 y.o. year old male  has a past medical history of Anal fissure, AR (allergic rhinitis), Asthma, ED (erectile dysfunction), GERD (gastroesophageal reflux disease), Hypogonadism male, Impaired fasting glucose, Intertriginous candidiasis, Metabolic syndrome, Metabolic syndrome X, Obesity, OSA (obstructive sleep apnea), Pilonidal cyst, and Rupture of plantaris tendon. here with :  1.  Obstructive sleep apnea on CPAP  The patient CPAP download shows suboptimal compliance.  Patient is encouraged to use machine nightly and greater than 4 hours each night.  He is advised that if his symptoms worsen or he develops new symptoms he should let us know.  He will follow-up in 1 year or sooner if needed.   I spent 15 minutes with the patient. 50% of this time was spent discussing his CPAP  download   Butch Penny, MSN, NP-C 08/26/2018, 9:16 AM [facility] [address], [address] [place], [place] [address] [phone]  I reviewed the above note and documentation by the Nurse Practitioner and agree with the history, physical exam, assessment and plan as outlined above. I was immediately available for face-to-face consultation. Huston Foley, MD, PhD [facility])

## 2018-08-26 NOTE — Patient Instructions (Signed)
Your Plan:  Try using cpap nightly and >4 hours each night If your symptoms worsen or you develop new symptoms please let us know.   Thank you for coming to see [place] at [facility]. I hope we have been able to provide you high quality care today.  You may receive a patient satisfaction survey over the next few weeks. We would appreciate your feedback and comments so that we may continue to improve ourselves and the health of our patients.

## 2018-09-15 DIAGNOSIS — H5211 Myopia, right eye: Secondary | ICD-10-CM | POA: Diagnosis not present

## 2018-10-01 DIAGNOSIS — R7301 Impaired fasting glucose: Secondary | ICD-10-CM | POA: Diagnosis not present

## 2018-10-01 DIAGNOSIS — E291 Testicular hypofunction: Secondary | ICD-10-CM | POA: Diagnosis not present

## 2018-10-07 DIAGNOSIS — Z0000 Encounter for general adult medical examination without abnormal findings: Secondary | ICD-10-CM | POA: Diagnosis not present

## 2018-10-07 DIAGNOSIS — R7301 Impaired fasting glucose: Secondary | ICD-10-CM | POA: Diagnosis not present

## 2018-10-07 DIAGNOSIS — Z23 Encounter for immunization: Secondary | ICD-10-CM | POA: Diagnosis not present

## 2018-10-07 DIAGNOSIS — E298 Other testicular dysfunction: Secondary | ICD-10-CM | POA: Diagnosis not present

## 2018-10-07 DIAGNOSIS — Z1389 Encounter for screening for other disorder: Secondary | ICD-10-CM | POA: Diagnosis not present

## 2018-10-07 DIAGNOSIS — G4733 Obstructive sleep apnea (adult) (pediatric): Secondary | ICD-10-CM | POA: Diagnosis not present

## 2018-10-07 DIAGNOSIS — E7849 Other hyperlipidemia: Secondary | ICD-10-CM | POA: Diagnosis not present

## 2018-12-15 DIAGNOSIS — N492 Inflammatory disorders of scrotum: Secondary | ICD-10-CM | POA: Diagnosis not present

## 2018-12-15 DIAGNOSIS — Z6835 Body mass index (BMI) 35.0-35.9, adult: Secondary | ICD-10-CM | POA: Diagnosis not present

## 2019-01-13 DIAGNOSIS — K648 Other hemorrhoids: Secondary | ICD-10-CM | POA: Diagnosis not present

## 2019-01-13 DIAGNOSIS — Z86010 Personal history of colonic polyps: Secondary | ICD-10-CM | POA: Diagnosis not present

## 2019-01-13 DIAGNOSIS — Z1211 Encounter for screening for malignant neoplasm of colon: Secondary | ICD-10-CM | POA: Diagnosis not present

## 2019-01-26 DIAGNOSIS — G43101 Migraine with aura, not intractable, with status migrainosus: Secondary | ICD-10-CM | POA: Diagnosis not present

## 2019-02-10 DIAGNOSIS — Z6835 Body mass index (BMI) 35.0-35.9, adult: Secondary | ICD-10-CM | POA: Diagnosis not present

## 2019-02-10 DIAGNOSIS — M5416 Radiculopathy, lumbar region: Secondary | ICD-10-CM | POA: Diagnosis not present

## 2019-02-10 DIAGNOSIS — L0292 Furuncle, unspecified: Secondary | ICD-10-CM | POA: Diagnosis not present

## 2019-02-10 DIAGNOSIS — H938X9 Other specified disorders of ear, unspecified ear: Secondary | ICD-10-CM | POA: Diagnosis not present

## 2019-05-26 DIAGNOSIS — Z03818 Encounter for observation for suspected exposure to other biological agents ruled out: Secondary | ICD-10-CM | POA: Diagnosis not present

## 2019-08-25 DIAGNOSIS — M79604 Pain in right leg: Secondary | ICD-10-CM | POA: Diagnosis not present

## 2019-08-25 DIAGNOSIS — H52222 Regular astigmatism, left eye: Secondary | ICD-10-CM | POA: Diagnosis not present

## 2019-08-25 DIAGNOSIS — M79609 Pain in unspecified limb: Secondary | ICD-10-CM | POA: Diagnosis not present

## 2019-09-01 ENCOUNTER — Encounter: Payer: Self-pay | Admitting: Adult Health

## 2019-09-01 ENCOUNTER — Other Ambulatory Visit: Payer: Self-pay

## 2019-09-01 ENCOUNTER — Ambulatory Visit: Payer: BC Managed Care – PPO | Admitting: Adult Health

## 2019-09-01 VITALS — BP 124/71 | HR 77 | Temp 96.2°F | Ht 76.0 in | Wt 289.6 lb

## 2019-09-01 DIAGNOSIS — G4733 Obstructive sleep apnea (adult) (pediatric): Secondary | ICD-10-CM | POA: Diagnosis not present

## 2019-09-01 DIAGNOSIS — Z9989 Dependence on other enabling machines and devices: Secondary | ICD-10-CM

## 2019-09-01 DIAGNOSIS — M7661 Achilles tendinitis, right leg: Secondary | ICD-10-CM | POA: Diagnosis not present

## 2019-09-01 DIAGNOSIS — M1711 Unilateral primary osteoarthritis, right knee: Secondary | ICD-10-CM | POA: Diagnosis not present

## 2019-09-01 NOTE — Progress Notes (Addendum)
PATIENT: Joni FearsMark D Farabaugh DOB: 1971/10/05  REASON FOR VISIT: follow up HISTORY FROM: patient  HISTORY OF PRESENT ILLNESS: Today 09/01/19 :  Mr. Gaynelle AduMcCain is a 48 year old male with a history of obstructive sleep apnea on CPAP.  His download indicates that he uses machine 27 out of 30 days for compliance of 90%.  He uses machine greater than 4 hours 25 out of 30 days for compliance of 83%.  On average he uses his machine 6 hours and 45 minutes.  His residual AHI is 1.6 on 9 cm of water with EPR 3.  His leak in the 95th percentile is 9.  Overall he is doing well.  He denies any new issues.  He returns today for an evaluation.  HISTORY 08/26/18:  Mr. Gaynelle AduMcCain is a 48 year old male with a history of obstructive sleep apnea on CPAP.  His CPAP download indicates that he uses machine 18 out of 30 days for compliance of 53%. he used his machine greater than 4 hours each night that he uses machine.  On average he uses his machine 6 hours and 46 minutes.  His residual AHI is 1.2 on 9 cm of water with EPR 3.  He does not have a significant leak.  The patient states that he is unable to use the machine nightly due to intimacy issues as well as allergies.  He returns today for evaluation.   REVIEW OF SYSTEMS: Out of a complete 14 system review of symptoms, the patient complains only of the following symptoms, and all other reviewed systems are negative.  ALLERGIES: No Known Allergies  HOME MEDICATIONS: Outpatient Medications Prior to Visit  Medication Sig Dispense Refill  . cyclobenzaprine (FLEXERIL) 10 MG tablet Take 10 mg by mouth 3 (three) times daily as needed for muscle spasms.    . fluocinolone (VANOS) 0.01 % cream Apply topically as needed.    . fluticasone (FLONASE) 50 MCG/ACT nasal spray Place into both nostrils daily.    Marland Kitchen. ibuprofen (ADVIL,MOTRIN) 800 MG tablet Take 800 mg by mouth every 8 (eight) hours as needed.    Marland Kitchen. ketotifen (ALAWAY) 0.025 % ophthalmic solution 1 drop 2 (two) times daily.     Marland Kitchen. omeprazole-sodium bicarbonate (ZEGERID) 40-1100 MG capsule   10  . Testosterone 20.25 MG/ACT (1.62%) GEL   4  . triamcinolone cream (KENALOG) 0.5 % Apply 1 application topically daily.     Facility-Administered Medications Prior to Visit  Medication Dose Route Frequency Provider Last Rate Last Dose  . betamethasone acetate-betamethasone sodium phosphate (CELESTONE) injection 3 mg  3 mg Intramuscular Once Gala LewandowskyEvans, Brent M, DPM      . betamethasone acetate-betamethasone sodium phosphate (CELESTONE) injection 3 mg  3 mg Intramuscular Once Felecia ShellingEvans, Brent M, DPM        PAST MEDICAL HISTORY: Past Medical History:  Diagnosis Date  . Anal fissure   . AR (allergic rhinitis)   . Asthma   . ED (erectile dysfunction)   . GERD (gastroesophageal reflux disease)   . Hypogonadism male   . Impaired fasting glucose   . Intertriginous candidiasis   . Metabolic syndrome   . Metabolic syndrome X   . Obesity   . OSA (obstructive sleep apnea)   . Pilonidal cyst   . Rupture of plantaris tendon     PAST SURGICAL HISTORY: Past Surgical History:  Procedure Laterality Date  . KNEE ARTHROSCOPY Left 2004  . KNEE ARTHROSCOPY Right 08/02/2015   Procedure: KNEE ARTHROSCOPY WITH REMOVAL LOOSE BODIES AND DEBRIDEMENT  CHONDROMALACIA;  Surgeon: Gean Birchwood, MD;  Location: [facility];  Service: Orthopedics;  Laterality: Right;  . LASIK    . VASECTOMY      FAMILY HISTORY: Family History  Problem Relation Age of Onset  . Hypertension Father   . Diabetes Mellitus II Father   . Hyperlipidemia Father   . Thyroid disease Father   . Colon polyps Father   . Thyroid disease Mother   . Colon polyps Mother   . COPD Paternal Grandmother   . Heart attack Paternal Grandfather   . Colon cancer Paternal Aunt 30    SOCIAL HISTORY: Social History   Socioeconomic History  . Marital status: Married    Spouse name: Vikki Ports   . Number of children: 3  . Years of education: Assoc  . Highest education  level: Not on file  Occupational History  . Occupation: Passenger transport manager  Social Needs  . Financial resource strain: Not on file  . Food insecurity    Worry: Not on file    Inability: Not on file  . Transportation needs    Medical: Not on file    Non-medical: Not on file  Tobacco Use  . Smoking status: Never Smoker  . Smokeless tobacco: Never Used  Substance and Sexual Activity  . Alcohol use: Yes    Alcohol/week: 1.0 standard drinks    Types: 1 Cans of beer per week    Comment: social  . Drug use: No  . Sexual activity: Yes  Lifestyle  . Physical activity    Days per week: Not on file    Minutes per session: Not on file  . Stress: Not on file  Relationships  . Social Musician on phone: Not on file    Gets together: Not on file    Attends religious service: Not on file    Active member of club or organization: Not on file    Attends meetings of clubs or organizations: Not on file    Relationship status: Not on file  . Intimate partner violence    Fear of current or ex partner: Not on file    Emotionally abused: Not on file    Physically abused: Not on file    Forced sexual activity: Not on file  Other Topics Concern  . Not on file  Social History Narrative   Daily caffeine 5-6 12oz drinks       PHYSICAL EXAM  Vitals:   09/01/19 0839  BP: 124/71  Pulse: 77  Temp: (!) 96.2 F (35.7 C)  TempSrc: Oral  Weight: 289 lb 9.6 oz (131.4 kg)  Height: 6\' 4"  (1.93 m)   Body mass index is 35.25 kg/m. Generalized: Well developed, in no acute distress  Chest: Lungs clear to auscultation bilaterally  Neurological examination  Mentation: Alert oriented to time, place, history taking. Follows all commands speech and language fluent Cranial nerve II-XII: Extraocular movements were full, visual field were full on confrontational test Head turning and shoulder shrug  were normal and symmetric. Motor: The motor testing reveals 5 over 5 strength of all 4  extremities. Good symmetric motor tone is noted throughout.  Sensory: Sensory testing is intact to soft touch on all 4 extremities. No evidence of extinction is noted.  Gait and station: Gait is normal.    DIAGNOSTIC DATA (LABS, IMAGING, TESTING) - I reviewed patient records, labs, notes, testing and imaging myself where available.  Lab Results  Component Value Date  HGB 17.3 (H) 08/02/2015      ASSESSMENT AND PLAN 48 y.o. year old male  has a past medical history of Anal fissure, AR (allergic rhinitis), Asthma, ED (erectile dysfunction), GERD (gastroesophageal reflux disease), Hypogonadism male, Impaired fasting glucose, Intertriginous candidiasis, Metabolic syndrome, Metabolic syndrome X, Obesity, OSA (obstructive sleep apnea), Pilonidal cyst, and Rupture of plantaris tendon. here with:  1. Obstructive sleep apnea on CPAP  The patient's CPAP download shows excellent compliance and good treatment of his apnea.  He is encouraged to continue using CPAP nightly and greater than 4 hours each night.  He is advised that if his symptoms worsen or he develops new symptoms he should let us know.  He will follow-up in 1 year or sooner if needed    I spent 15 minutes with the patient. 50% of this time was spent reviewing CPAP download   Ward Givens, MSN, NP-C 09/01/2019, 8:54 AM [facility] [address], [place], [place] [address] [phone]  I reviewed the above note and documentation by the Nurse Practitioner and agree with the history, exam, assessment and plan as outlined above. I was immediately available for consultation. Star Age, MD, PhD [facility])

## 2019-09-01 NOTE — Patient Instructions (Signed)
Continue using CPAP nightly and greater than 4 hours each night If your symptoms worsen or you develop new symptoms please let us know.   Please call [employer] at [phone], and press option 1 when prompted. Their customer service representatives will be glad to assist you. If they are unable to answer, please leave a message and they will call you back. Make sure to leave your name and return phone number.  

## 2019-09-14 DIAGNOSIS — M25561 Pain in right knee: Secondary | ICD-10-CM | POA: Diagnosis not present

## 2019-10-11 DIAGNOSIS — Z0000 Encounter for general adult medical examination without abnormal findings: Secondary | ICD-10-CM | POA: Diagnosis not present

## 2019-10-11 DIAGNOSIS — R7301 Impaired fasting glucose: Secondary | ICD-10-CM | POA: Diagnosis not present

## 2019-10-11 DIAGNOSIS — E291 Testicular hypofunction: Secondary | ICD-10-CM | POA: Diagnosis not present

## 2019-10-18 DIAGNOSIS — Z23 Encounter for immunization: Secondary | ICD-10-CM | POA: Diagnosis not present

## 2019-10-18 DIAGNOSIS — E8881 Metabolic syndrome: Secondary | ICD-10-CM | POA: Diagnosis not present

## 2019-10-18 DIAGNOSIS — E298 Other testicular dysfunction: Secondary | ICD-10-CM | POA: Diagnosis not present

## 2019-10-18 DIAGNOSIS — Z0000 Encounter for general adult medical examination without abnormal findings: Secondary | ICD-10-CM | POA: Diagnosis not present

## 2019-10-18 DIAGNOSIS — E785 Hyperlipidemia, unspecified: Secondary | ICD-10-CM | POA: Diagnosis not present

## 2019-10-18 DIAGNOSIS — R7301 Impaired fasting glucose: Secondary | ICD-10-CM | POA: Diagnosis not present

## 2019-10-18 DIAGNOSIS — Z1331 Encounter for screening for depression: Secondary | ICD-10-CM | POA: Diagnosis not present

## 2019-12-22 DIAGNOSIS — Z20828 Contact with and (suspected) exposure to other viral communicable diseases: Secondary | ICD-10-CM | POA: Diagnosis not present

## 2020-01-03 DIAGNOSIS — J3489 Other specified disorders of nose and nasal sinuses: Secondary | ICD-10-CM | POA: Diagnosis not present

## 2020-01-03 DIAGNOSIS — Z20828 Contact with and (suspected) exposure to other viral communicable diseases: Secondary | ICD-10-CM | POA: Diagnosis not present

## 2020-03-28 DIAGNOSIS — M1711 Unilateral primary osteoarthritis, right knee: Secondary | ICD-10-CM | POA: Diagnosis not present

## 2020-06-14 DIAGNOSIS — G8918 Other acute postprocedural pain: Secondary | ICD-10-CM | POA: Diagnosis not present

## 2020-06-14 DIAGNOSIS — M94261 Chondromalacia, right knee: Secondary | ICD-10-CM | POA: Diagnosis not present

## 2020-06-14 DIAGNOSIS — M2341 Loose body in knee, right knee: Secondary | ICD-10-CM | POA: Diagnosis not present

## 2020-06-14 DIAGNOSIS — M6751 Plica syndrome, right knee: Secondary | ICD-10-CM | POA: Diagnosis not present

## 2020-06-22 DIAGNOSIS — Z9889 Other specified postprocedural states: Secondary | ICD-10-CM | POA: Diagnosis not present

## 2020-09-04 ENCOUNTER — Encounter: Payer: Self-pay | Admitting: Adult Health

## 2020-09-04 ENCOUNTER — Ambulatory Visit: Payer: BLUE CROSS/BLUE SHIELD | Admitting: Adult Health

## 2020-09-04 VITALS — BP 131/81 | HR 79 | Ht 76.0 in | Wt 301.8 lb

## 2020-09-04 DIAGNOSIS — Z9989 Dependence on other enabling machines and devices: Secondary | ICD-10-CM | POA: Diagnosis not present

## 2020-09-04 DIAGNOSIS — G4733 Obstructive sleep apnea (adult) (pediatric): Secondary | ICD-10-CM | POA: Diagnosis not present

## 2020-09-04 NOTE — Patient Instructions (Signed)
Continue using CPAP nightly and greater than 4 hours each night °If your symptoms worsen or you develop new symptoms please let us know.  ° °

## 2020-09-04 NOTE — Progress Notes (Addendum)
PATIENT: John Rangel DOB: 1971-03-25  REASON FOR VISIT: follow up HISTORY FROM: patient  HISTORY OF PRESENT ILLNESS: Today 09/04/20:  [name] is a 49 year old male with a history of obstructive sleep apnea on CPAP.  His download indicates that he uses machine nightly for compliance of 100%.  He uses machine greater than 4 hours 29 days for compliance of 97%.  On average he uses his machine 7 hours and 20 minutes.  His residual AHI is 4.3 on 9 cm of water with EPR of 3.  Leak in the 95th percentile is 6.6 L/min.  HISTORY 09/01/19 :  [name] is a 49 year old male with a history of obstructive sleep apnea on CPAP.  His download indicates that he uses machine 27 out of 30 days for compliance of 90%.  He uses machine greater than 4 hours 25 out of 30 days for compliance of 83%.  On average he uses his machine 6 hours and 45 minutes.  His residual AHI is 1.6 on 9 cm of water with EPR 3.  His leak in the 95th percentile is 9.  Overall he is doing well.  He denies any new issues.  He returns today for an evaluation.  REVIEW OF SYSTEMS: Out of a complete 14 system review of symptoms, the patient complains only of the following symptoms, and all other reviewed systems are negative.  FSS 27 ESS 10  ALLERGIES: No Known Allergies  HOME MEDICATIONS: Outpatient Medications Prior to Visit  Medication Sig Dispense Refill  . cyclobenzaprine (FLEXERIL) 10 MG tablet Take 10 mg by mouth 3 (three) times daily as needed for muscle spasms.    . fluocinolone (VANOS) 0.01 % cream Apply topically as needed.    . fluticasone (FLONASE) 50 MCG/ACT nasal spray Place into both nostrils daily.    Marland Kitchen ibuprofen (ADVIL,MOTRIN) 800 MG tablet Take 800 mg by mouth every 8 (eight) hours as needed.    Marland Kitchen ketotifen (ALAWAY) 0.025 % ophthalmic solution 1 drop 2 (two) times daily.    Marland Kitchen omeprazole-sodium bicarbonate (ZEGERID) 40-1100 MG capsule   10  . Testosterone 20.25 MG/ACT (1.62%) GEL   4  . triamcinolone cream  (KENALOG) 0.5 % Apply 1 application topically daily.     Facility-Administered Medications Prior to Visit  Medication Dose Route Frequency Provider Last Rate Last Admin  . betamethasone acetate-betamethasone sodium phosphate (CELESTONE) injection 3 mg  3 mg Intramuscular Once Gala Lewandowsky M, DPM      . betamethasone acetate-betamethasone sodium phosphate (CELESTONE) injection 3 mg  3 mg Intramuscular Once Felecia Shelling, DPM        PAST MEDICAL HISTORY: Past Medical History:  Diagnosis Date  . Anal fissure   . AR (allergic rhinitis)   . Asthma   . ED (erectile dysfunction)   . GERD (gastroesophageal reflux disease)   . Hypogonadism male   . Impaired fasting glucose   . Intertriginous candidiasis   . Metabolic syndrome   . Metabolic syndrome X   . Obesity   . OSA (obstructive sleep apnea)   . Pilonidal cyst   . Rupture of plantaris tendon     PAST SURGICAL HISTORY: Past Surgical History:  Procedure Laterality Date  . KNEE ARTHROSCOPY Left 2004  . KNEE ARTHROSCOPY Right 08/02/2015   Procedure: KNEE ARTHROSCOPY WITH REMOVAL LOOSE BODIES AND DEBRIDEMENT CHONDROMALACIA;  Surgeon: Gean Birchwood, MD;  Location: [facility];  Service: Orthopedics;  Laterality: Right;  . LASIK    . VASECTOMY  FAMILY HISTORY: Family History  Problem Relation Age of Onset  . Hypertension Father   . Diabetes Mellitus II Father   . Hyperlipidemia Father   . Thyroid disease Father   . Colon polyps Father   . Thyroid disease Mother   . Colon polyps Mother   . COPD Paternal Grandmother   . Heart attack Paternal Grandfather   . Colon cancer Paternal Aunt 30    SOCIAL HISTORY: Social History   Socioeconomic History  . Marital status: Married    Spouse name: Vikki Ports   . Number of children: 3  . Years of education: Assoc  . Highest education level: Not on file  Occupational History  . Occupation: Passenger transport manager  Tobacco Use  . Smoking status: Never Smoker  .  Smokeless tobacco: Never Used  Substance and Sexual Activity  . Alcohol use: Yes    Alcohol/week: 1.0 standard drink    Types: 1 Cans of beer per week    Comment: social  . Drug use: No  . Sexual activity: Yes  Other Topics Concern  . Not on file  Social History Narrative   Daily caffeine 5-6 12oz drinks    Social Determinants of Health   Financial Resource Strain:   . Difficulty of Paying Living Expenses: Not on file  Food Insecurity:   . Worried About Programme researcher, broadcasting/film/video in the Last Year: Not on file  . Ran Out of Food in the Last Year: Not on file  Transportation Needs:   . Lack of Transportation (Medical): Not on file  . Lack of Transportation (Non-Medical): Not on file  Physical Activity:   . Days of Exercise per Week: Not on file  . Minutes of Exercise per Session: Not on file  Stress:   . Feeling of Stress : Not on file  Social Connections:   . Frequency of Communication with Friends and Family: Not on file  . Frequency of Social Gatherings with Friends and Family: Not on file  . Attends Religious Services: Not on file  . Active Member of Clubs or Organizations: Not on file  . Attends Banker Meetings: Not on file  . Marital Status: Not on file  Intimate Partner Violence:   . Fear of Current or Ex-Partner: Not on file  . Emotionally Abused: Not on file  . Physically Abused: Not on file  . Sexually Abused: Not on file      PHYSICAL EXAM  Vitals:   09/04/20 0902  BP: 131/81  Pulse: 79  Weight: (!) 301 lb 12.8 oz (136.9 kg)  Height: 6\' 4"  (1.93 m)   Body mass index is 36.74 kg/m.  Generalized: Well developed, in no acute distress  Chest: Lungs clear to auscultation bilaterally  Neurological examination  Mentation: Alert oriented to time, place, history taking. Follows all commands speech and language fluent Cranial nerve II-XII: Extraocular movements were full, visual field were full on confrontational test Head turning and shoulder shrug   were normal and symmetric. Motor: The motor testing reveals 5 over 5 strength of all 4 extremities. Good symmetric motor tone is noted throughout.  Sensory: Sensory testing is intact to soft touch on all 4 extremities. No evidence of extinction is noted.  Gait and station: Gait is normal.    DIAGNOSTIC DATA (LABS, IMAGING, TESTING) - I reviewed patient records, labs, notes, testing and imaging myself where available.  Lab Results  Component Value Date   HGB 17.3 (H) 08/02/2015   No results  found for: NA, K, CL, CO2, GLUCOSE, BUN, CREATININE, CALCIUM, PROT, ALBUMIN, AST, ALT, ALKPHOS, BILITOT, GFRNONAA, GFRAA No results found for: CHOL, HDL, LDLCALC, LDLDIRECT, TRIG, CHOLHDL No results found for: IWPY0D No results found for: VITAMINB12 No results found for: TSH    ASSESSMENT AND PLAN 49 y.o. year old male  has a past medical history of Anal fissure, AR (allergic rhinitis), Asthma, ED (erectile dysfunction), GERD (gastroesophageal reflux disease), Hypogonadism male, Impaired fasting glucose, Intertriginous candidiasis, Metabolic syndrome, Metabolic syndrome X, Obesity, OSA (obstructive sleep apnea), Pilonidal cyst, and Rupture of plantaris tendon. here with:  1. OSA on CPAP  - CPAP compliance excellent - Good treatment of AHI  - Encourage patient to use CPAP nightly and > 4 hours each night - F/U in 1 year or sooner if needed   I spent 20 minutes of face-to-face and non-face-to-face time with patient.  This included previsit chart review, lab review, study review, order entry, electronic health record documentation, patient education.  Butch Penny, MSN, NP-C 09/04/2020, 9:07 AM [facility] [address], [address] [place], [place] [address] [phone]  I reviewed the above note and documentation by the Nurse Practitioner and agree with the history, exam, assessment and plan as outlined above. I was available for consultation. Huston Foley, MD, PhD [facility])

## 2020-10-03 DIAGNOSIS — H524 Presbyopia: Secondary | ICD-10-CM | POA: Diagnosis not present

## 2020-10-12 DIAGNOSIS — Z125 Encounter for screening for malignant neoplasm of prostate: Secondary | ICD-10-CM | POA: Diagnosis not present

## 2020-10-12 DIAGNOSIS — R7301 Impaired fasting glucose: Secondary | ICD-10-CM | POA: Diagnosis not present

## 2020-10-12 DIAGNOSIS — Z0000 Encounter for general adult medical examination without abnormal findings: Secondary | ICD-10-CM | POA: Diagnosis not present

## 2020-10-12 DIAGNOSIS — E785 Hyperlipidemia, unspecified: Secondary | ICD-10-CM | POA: Diagnosis not present

## 2020-10-19 DIAGNOSIS — Z1389 Encounter for screening for other disorder: Secondary | ICD-10-CM | POA: Diagnosis not present

## 2020-10-19 DIAGNOSIS — E785 Hyperlipidemia, unspecified: Secondary | ICD-10-CM | POA: Diagnosis not present

## 2020-10-19 DIAGNOSIS — Z23 Encounter for immunization: Secondary | ICD-10-CM | POA: Diagnosis not present

## 2020-10-19 DIAGNOSIS — E298 Other testicular dysfunction: Secondary | ICD-10-CM | POA: Diagnosis not present

## 2020-10-19 DIAGNOSIS — Z1331 Encounter for screening for depression: Secondary | ICD-10-CM | POA: Diagnosis not present

## 2020-10-19 DIAGNOSIS — Z0000 Encounter for general adult medical examination without abnormal findings: Secondary | ICD-10-CM | POA: Diagnosis not present

## 2020-10-24 ENCOUNTER — Other Ambulatory Visit: Payer: Self-pay | Admitting: Internal Medicine

## 2020-10-24 DIAGNOSIS — M5416 Radiculopathy, lumbar region: Secondary | ICD-10-CM

## 2020-11-13 ENCOUNTER — Ambulatory Visit
Admission: RE | Admit: 2020-11-13 | Discharge: 2020-11-13 | Disposition: A | Discharge status: Home / Self Care | Payer: Self-pay | Source: Ambulatory Visit | Attending: Internal Medicine | Admitting: Internal Medicine

## 2020-11-13 ENCOUNTER — Ambulatory Visit
Admission: RE | Admit: 2020-11-13 | Discharge: 2020-11-13 | Disposition: A | Discharge status: Home / Self Care | Payer: BLUE CROSS/BLUE SHIELD | Source: Ambulatory Visit | Attending: Internal Medicine | Admitting: Internal Medicine

## 2020-11-13 DIAGNOSIS — D1809 Hemangioma of other sites: Secondary | ICD-10-CM | POA: Diagnosis not present

## 2020-11-13 DIAGNOSIS — M4316 Spondylolisthesis, lumbar region: Secondary | ICD-10-CM | POA: Diagnosis not present

## 2020-11-13 DIAGNOSIS — M5416 Radiculopathy, lumbar region: Secondary | ICD-10-CM

## 2020-11-13 DIAGNOSIS — Z01818 Encounter for other preprocedural examination: Secondary | ICD-10-CM | POA: Diagnosis not present

## 2020-11-29 DIAGNOSIS — R030 Elevated blood-pressure reading, without diagnosis of hypertension: Secondary | ICD-10-CM | POA: Diagnosis not present

## 2020-11-29 DIAGNOSIS — M4310 Spondylolisthesis, site unspecified: Secondary | ICD-10-CM | POA: Diagnosis not present

## 2020-11-29 DIAGNOSIS — Z6836 Body mass index (BMI) 36.0-36.9, adult: Secondary | ICD-10-CM | POA: Diagnosis not present

## 2020-12-20 DIAGNOSIS — Z20828 Contact with and (suspected) exposure to other viral communicable diseases: Secondary | ICD-10-CM | POA: Diagnosis not present

## 2021-01-08 DIAGNOSIS — M5416 Radiculopathy, lumbar region: Secondary | ICD-10-CM | POA: Diagnosis not present

## 2021-01-08 DIAGNOSIS — M4310 Spondylolisthesis, site unspecified: Secondary | ICD-10-CM | POA: Diagnosis not present

## 2021-01-16 DIAGNOSIS — M545 Low back pain, unspecified: Secondary | ICD-10-CM | POA: Diagnosis not present

## 2021-01-16 DIAGNOSIS — M5416 Radiculopathy, lumbar region: Secondary | ICD-10-CM | POA: Diagnosis not present

## 2021-01-16 DIAGNOSIS — M4310 Spondylolisthesis, site unspecified: Secondary | ICD-10-CM | POA: Diagnosis not present

## 2021-01-20 IMAGING — MR MR LUMBAR SPINE W/O CM
4 of 5 series · 18 of 48 positions shown · non-contrast
Comparison: None.

CLINICAL DATA: Left lumbar radiculopathy.

EXAM:
MRI LUMBAR SPINE WITHOUT CONTRAST
TECHNIQUE: Multiplanar, multisequence MR imaging of the lumbar spine was
performed. No intravenous contrast was administered.

[Series 5: T2 · sagittal · 4.0mm · 0.78mm/px · 5 of 15 slices shown (1 of 2)]
[im 1/15]
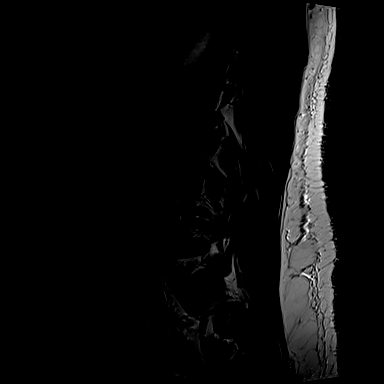
[im 4/15]
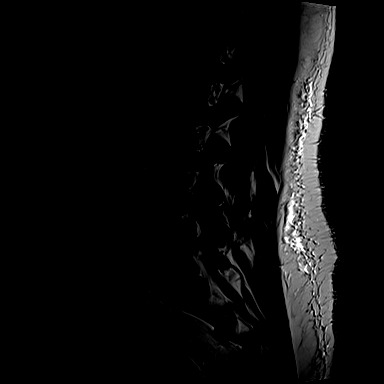
[im 8/15]
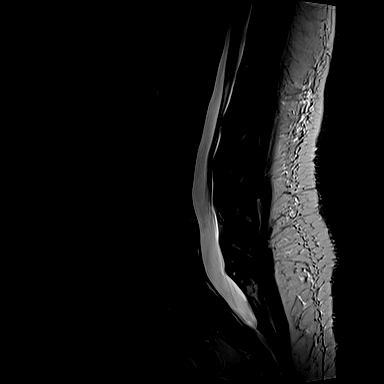
[im 11/15]
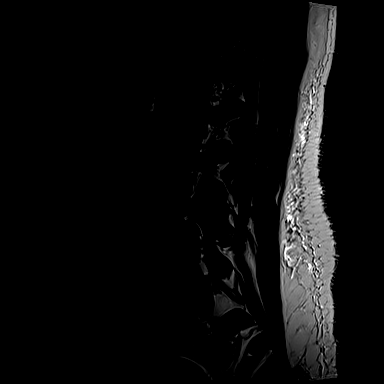
[im 15/15]
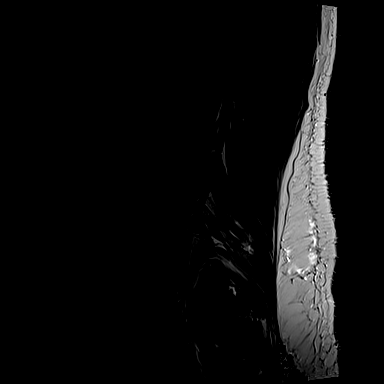

[Series 6: T1 · sagittal · 4.0mm · 0.78mm/px · 3 of 15 slices shown (1 of 2)]
[im 1/15]
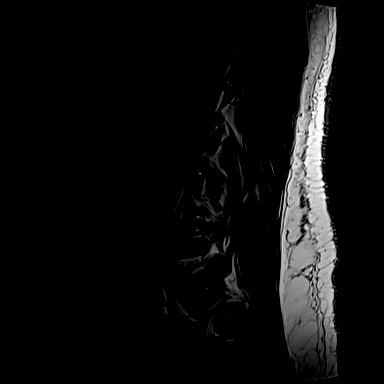
[im 8/15]
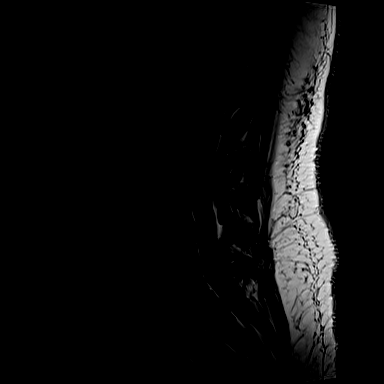
[im 15/15]
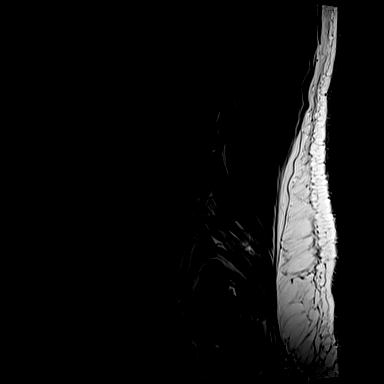

[Series 10: T1 · axial · 4.0mm · 0.28mm/px · z∈[-83,+97]mm · 3 of 42 slices shown (2 of 2)]
[im 6/42]
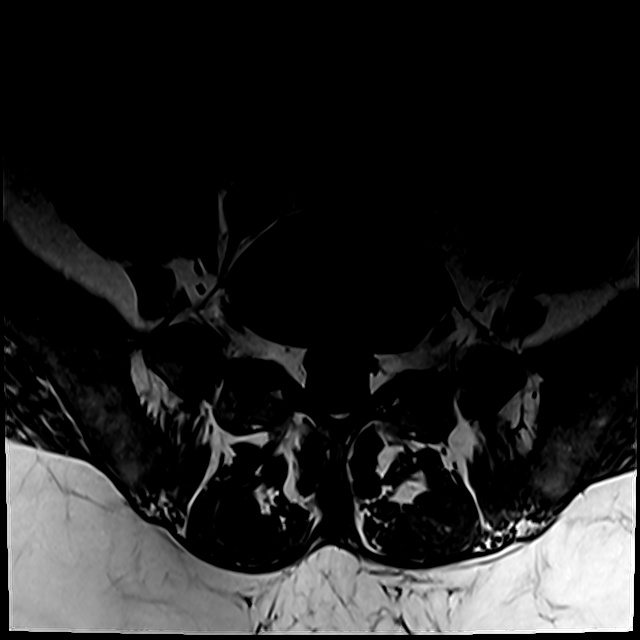
[im 22/42]
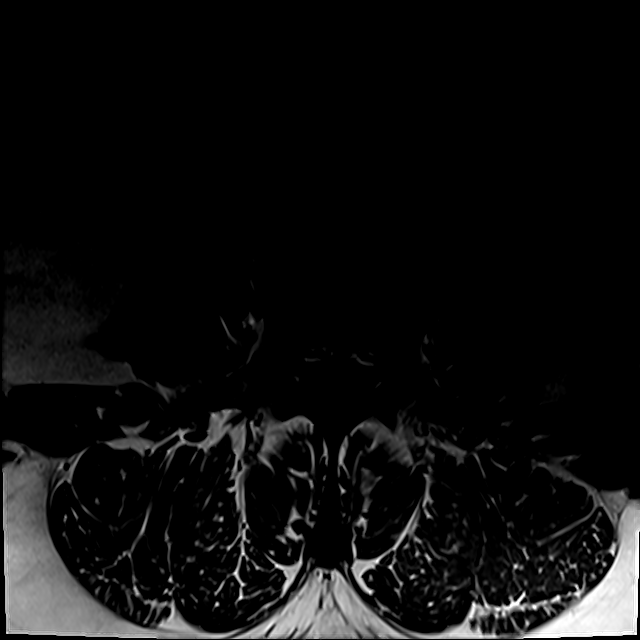
[im 36/42]
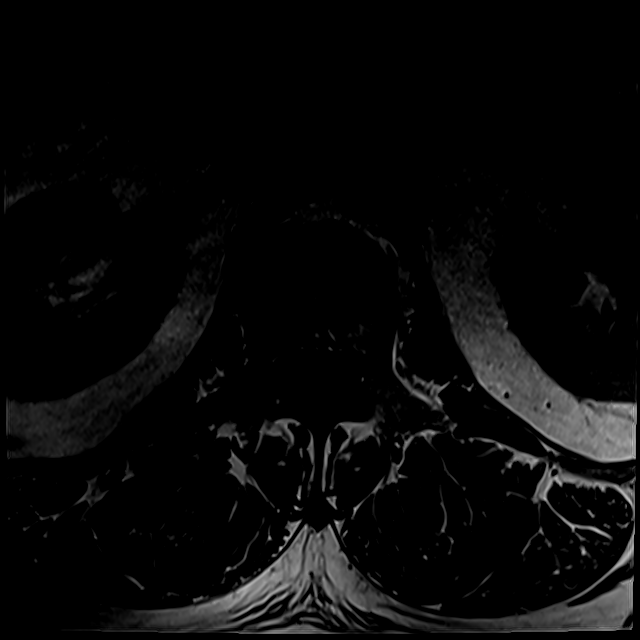

[Series 13: T2 · axial · 4.0mm · 0.28mm/px · z∈[-98,+97]mm · 7 of 42 slices shown (2 of 2)]
[im 3/42]
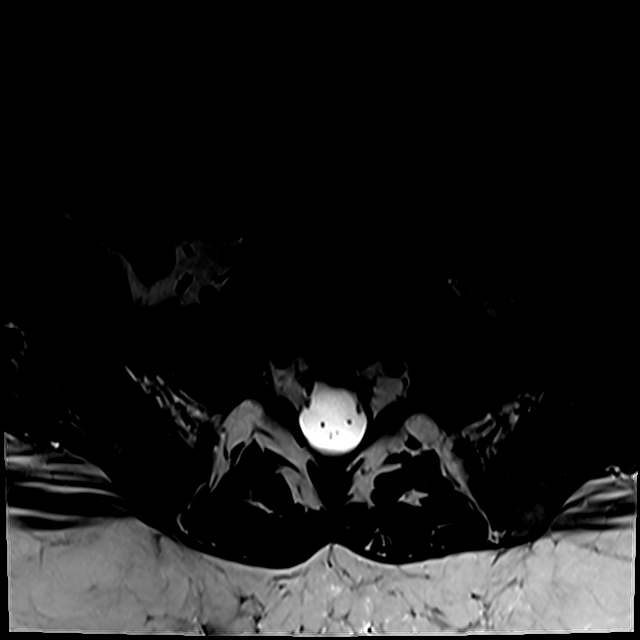
[im 6/42]
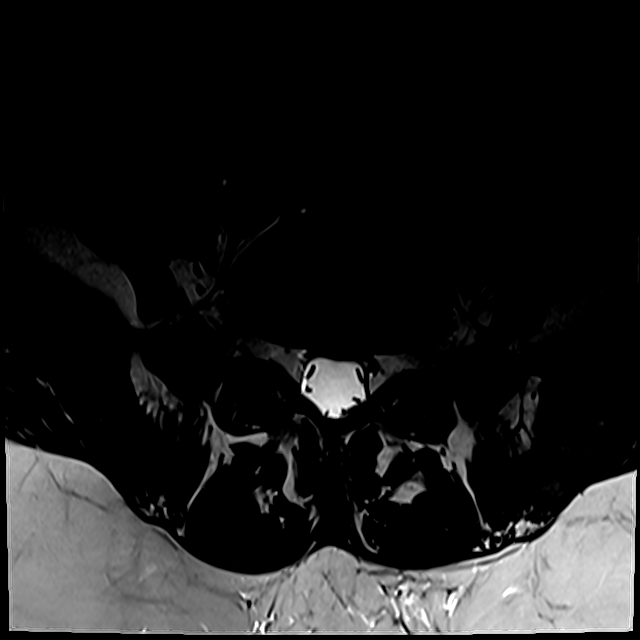
[im 9/42]
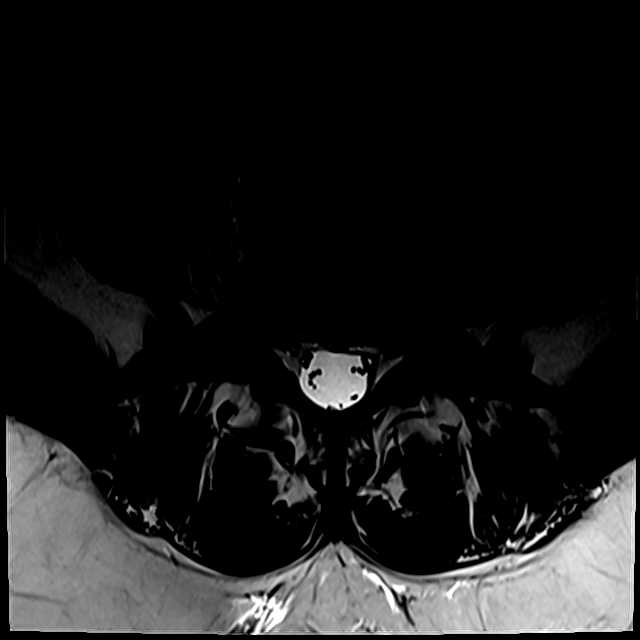
[im 14/42]
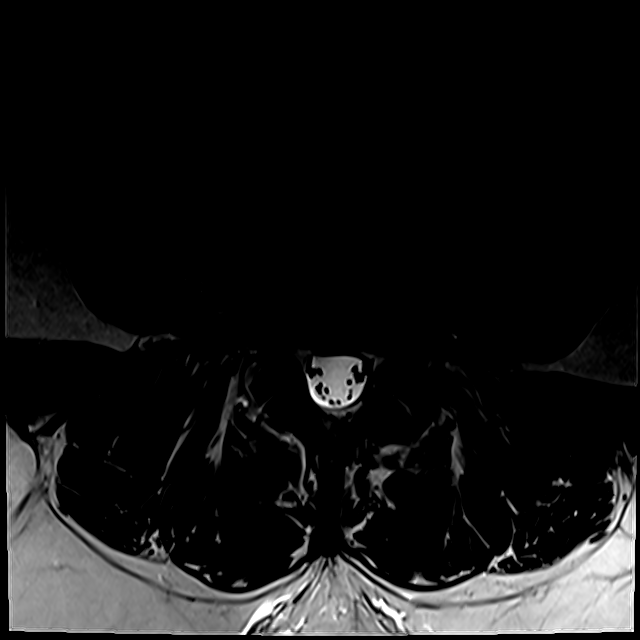
[im 20/42]
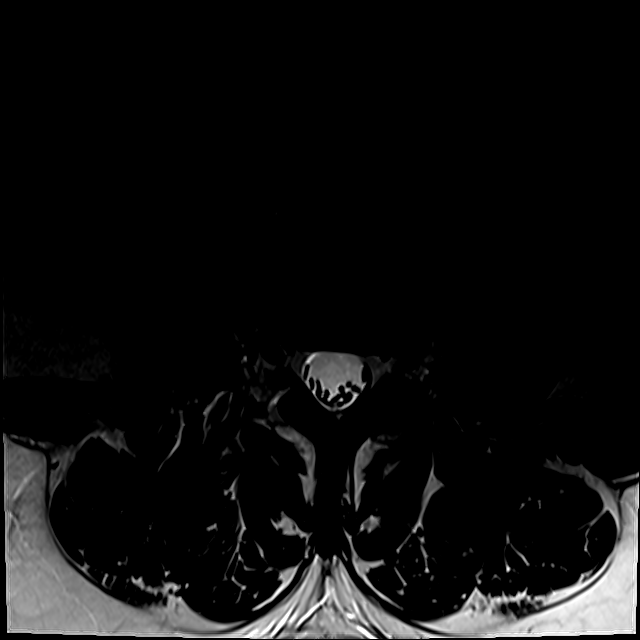
[im 22/42]
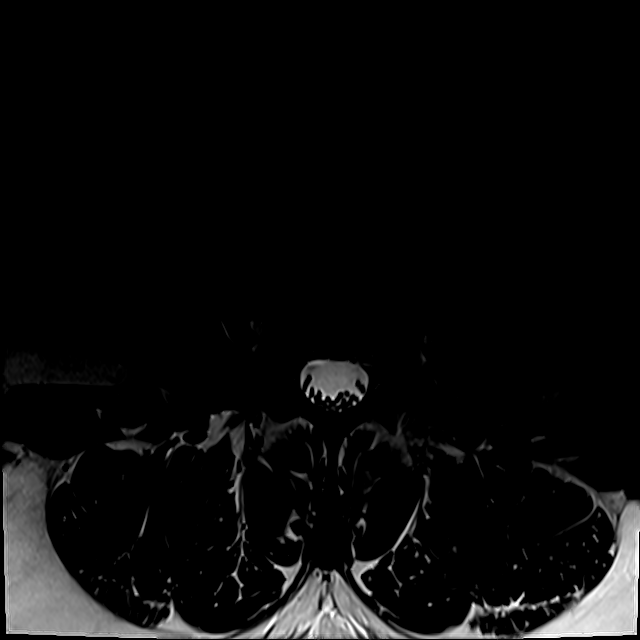
[im 36/42]
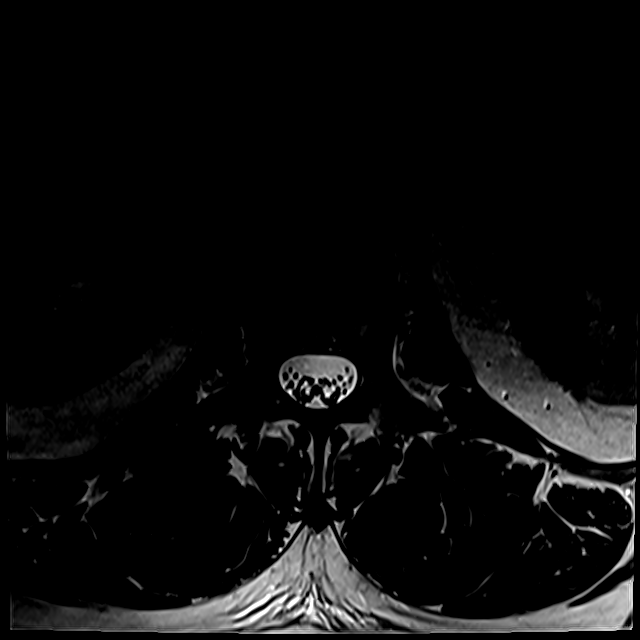

[18 of 48 positions shown; findings below may reference images not displayed]

FINDINGS: Segmentation:  Normal

Alignment:  6 mm anterolisthesis L4-5.  Remaining alignment normal.

Vertebrae: Bilateral pars defects of L4 appear chronic. No vertebral
body fracture. Scattered hemangiomata.

Conus medullaris and cauda equina: Conus extends to the L1-2 level.
Conus and cauda equina appear normal.

Paraspinal and other soft tissues: Negative for paraspinous mass or
adenopathy.

Disc levels:

L1-2: Negative

L2-3: Negative

L3-4: Negative

L4-5: 6 mm anterolisthesis with bilateral pars defects. Bilateral L4
nerve root impingement in the foramen.

L5-S1: Negative
IMPRESSION: Grade 1 anterolisthesis L4-5 with chronic bilateral pars defects of
L4. Bilateral L4 nerve root impingement.

## 2021-01-24 DIAGNOSIS — Z6837 Body mass index (BMI) 37.0-37.9, adult: Secondary | ICD-10-CM | POA: Diagnosis not present

## 2021-01-24 DIAGNOSIS — R030 Elevated blood-pressure reading, without diagnosis of hypertension: Secondary | ICD-10-CM | POA: Diagnosis not present

## 2021-01-24 DIAGNOSIS — M4310 Spondylolisthesis, site unspecified: Secondary | ICD-10-CM | POA: Diagnosis not present

## 2021-01-24 DIAGNOSIS — M5416 Radiculopathy, lumbar region: Secondary | ICD-10-CM | POA: Diagnosis not present

## 2021-01-25 DIAGNOSIS — M4310 Spondylolisthesis, site unspecified: Secondary | ICD-10-CM | POA: Diagnosis not present

## 2021-01-25 DIAGNOSIS — M545 Low back pain, unspecified: Secondary | ICD-10-CM | POA: Diagnosis not present

## 2021-01-25 DIAGNOSIS — M5416 Radiculopathy, lumbar region: Secondary | ICD-10-CM | POA: Diagnosis not present

## 2021-02-02 DIAGNOSIS — M545 Low back pain, unspecified: Secondary | ICD-10-CM | POA: Diagnosis not present

## 2021-02-02 DIAGNOSIS — M4310 Spondylolisthesis, site unspecified: Secondary | ICD-10-CM | POA: Diagnosis not present

## 2021-02-02 DIAGNOSIS — M5416 Radiculopathy, lumbar region: Secondary | ICD-10-CM | POA: Diagnosis not present

## 2021-02-14 DIAGNOSIS — M5416 Radiculopathy, lumbar region: Secondary | ICD-10-CM | POA: Diagnosis not present

## 2021-02-14 DIAGNOSIS — M545 Low back pain, unspecified: Secondary | ICD-10-CM | POA: Diagnosis not present

## 2021-02-14 DIAGNOSIS — M4310 Spondylolisthesis, site unspecified: Secondary | ICD-10-CM | POA: Diagnosis not present

## 2021-02-22 DIAGNOSIS — M5416 Radiculopathy, lumbar region: Secondary | ICD-10-CM | POA: Diagnosis not present

## 2021-02-22 DIAGNOSIS — M4310 Spondylolisthesis, site unspecified: Secondary | ICD-10-CM | POA: Diagnosis not present

## 2021-02-22 DIAGNOSIS — M545 Low back pain, unspecified: Secondary | ICD-10-CM | POA: Diagnosis not present

## 2021-03-22 DIAGNOSIS — M545 Low back pain, unspecified: Secondary | ICD-10-CM | POA: Diagnosis not present

## 2021-03-22 DIAGNOSIS — M4310 Spondylolisthesis, site unspecified: Secondary | ICD-10-CM | POA: Diagnosis not present

## 2021-03-22 DIAGNOSIS — M5416 Radiculopathy, lumbar region: Secondary | ICD-10-CM | POA: Diagnosis not present

## 2021-05-24 DIAGNOSIS — M5416 Radiculopathy, lumbar region: Secondary | ICD-10-CM | POA: Diagnosis not present

## 2021-05-24 DIAGNOSIS — M4310 Spondylolisthesis, site unspecified: Secondary | ICD-10-CM | POA: Diagnosis not present

## 2021-06-25 DIAGNOSIS — M5416 Radiculopathy, lumbar region: Secondary | ICD-10-CM | POA: Diagnosis not present

## 2021-07-11 DIAGNOSIS — M5416 Radiculopathy, lumbar region: Secondary | ICD-10-CM | POA: Diagnosis not present

## 2021-07-11 DIAGNOSIS — M4310 Spondylolisthesis, site unspecified: Secondary | ICD-10-CM | POA: Diagnosis not present

## 2021-09-05 ENCOUNTER — Ambulatory Visit: Payer: BC Managed Care – PPO | Admitting: Adult Health

## 2021-09-05 ENCOUNTER — Other Ambulatory Visit: Payer: Self-pay

## 2021-09-05 ENCOUNTER — Encounter: Payer: Self-pay | Admitting: Adult Health

## 2021-09-05 VITALS — BP 134/85 | HR 70 | Ht 76.0 in | Wt 304.2 lb

## 2021-09-05 DIAGNOSIS — Z9989 Dependence on other enabling machines and devices: Secondary | ICD-10-CM | POA: Diagnosis not present

## 2021-09-05 DIAGNOSIS — G4733 Obstructive sleep apnea (adult) (pediatric): Secondary | ICD-10-CM

## 2021-09-05 NOTE — Patient Instructions (Signed)
Continue using CPAP nightly and greater than 4 hours each night °If your symptoms worsen or you develop new symptoms please let us know.  ° °

## 2021-09-05 NOTE — Progress Notes (Addendum)
PATIENT: John Rangel DOB: [birth year]  REASON FOR VISIT: follow up HISTORY FROM: patient  HISTORY OF PRESENT ILLNESS: Today 09/05/21:  John Rangel is a 50 year old male with a history of obstructive sleep apnea on CPAP.  He returns today for follow-up.  He reports that the CPAP is working well.  Denies any new issues.  Returns today for an evaluation.    09/04/20: John Rangel is a 50 year old male with a history of obstructive sleep apnea on CPAP.  His download indicates that he uses machine nightly for compliance of 100%.  He uses machine greater than 4 hours 29 days for compliance of 97%.  On average he uses his machine 7 hours and 20 minutes.  His residual AHI is 4.3 on 9 cm of water with EPR of 3.  Leak in the 95th percentile is 6.6 L/min.  HISTORY 09/01/19 :   John Rangel is a 50 year old male with a history of obstructive sleep apnea on CPAP.  His download indicates that he uses machine 27 out of 30 days for compliance of 90%.  He uses machine greater than 4 hours 25 out of 30 days for compliance of 83%.  On average he uses his machine 6 hours and 45 minutes.  His residual AHI is 1.6 on 9 cm of water with EPR 3.  His leak in the 95th percentile is 9.  Overall he is doing well.  He denies any new issues.  He returns today for an evaluation.  REVIEW OF SYSTEMS: Out of a complete 14 system review of symptoms, the patient complains only of the following symptoms, and all other reviewed systems are negative.  FSS 25 ESS 7  ALLERGIES: No Known Allergies  HOME MEDICATIONS: Outpatient Medications Prior to Visit  Medication Sig Dispense Refill   cyclobenzaprine (FLEXERIL) 10 MG tablet Take 10 mg by mouth 3 (three) times daily as needed for muscle spasms.     fluocinolone (VANOS) 0.01 % cream Apply topically as needed.     fluticasone (FLONASE) 50 MCG/ACT nasal spray Place into both nostrils daily.     ibuprofen (ADVIL) 200 MG tablet Take 400-600 mg by mouth every 6 (six) hours as  needed.     ketotifen (ZADITOR) 0.025 % ophthalmic solution 1 drop 2 (two) times daily.     Multiple Vitamin (MULTIVITAMIN) tablet Take 1 tablet by mouth daily.     naproxen sodium (ALEVE) 220 MG tablet Take 440 mg by mouth daily as needed.     omeprazole-sodium bicarbonate (ZEGERID) 40-1100 MG capsule Take 1 capsule by mouth daily before breakfast.  10   Testosterone 20.25 MG/ACT (1.62%) GEL daily.  4   triamcinolone cream (KENALOG) 0.5 % Apply 1 application topically daily.     ibuprofen (ADVIL,MOTRIN) 800 MG tablet Take 800 mg by mouth every 8 (eight) hours as needed.     Facility-Administered Medications Prior to Visit  Medication Dose Route Frequency Provider Last Rate Last Admin   betamethasone acetate-betamethasone sodium phosphate (CELESTONE) injection 3 mg  3 mg Intramuscular Once John Rangel, DPM       betamethasone acetate-betamethasone sodium phosphate (CELESTONE) injection 3 mg  3 mg Intramuscular Once John Rangel, DPM        PAST MEDICAL HISTORY: Past Medical History:  Diagnosis Date   Anal fissure    AR (allergic rhinitis)    Asthma    ED (erectile dysfunction)    GERD (gastroesophageal reflux disease)    Hypogonadism male  Impaired fasting glucose    Intertriginous candidiasis    Metabolic syndrome    Metabolic syndrome X    Obesity    OSA (obstructive sleep apnea)    Pilonidal cyst    Rupture of plantaris tendon     PAST SURGICAL HISTORY: Past Surgical History:  Procedure Laterality Date   KNEE ARTHROSCOPY Left 2004   KNEE ARTHROSCOPY Right 08/02/2015   Procedure: KNEE ARTHROSCOPY WITH REMOVAL LOOSE BODIES AND DEBRIDEMENT CHONDROMALACIA;  Surgeon: John Birchwood, MD;  Location: [facility];  Service: Orthopedics;  Laterality: Right;   LASIK     VASECTOMY      FAMILY HISTORY: Family History  Problem Relation Age of Onset   Hypertension Father    Diabetes Mellitus II Father    Hyperlipidemia Father    Thyroid disease Father    Colon  polyps Father    Thyroid disease Mother    Colon polyps Mother    COPD Paternal Grandmother    Heart attack Paternal Grandfather    Colon cancer Paternal Aunt 30    SOCIAL HISTORY: Social History   Socioeconomic History   Marital status: Married    Spouse name: John Rangel    Number of children: 3   Years of education: Assoc   Highest education level: Not on file  Occupational History   Occupation: Passenger transport manager  Tobacco Use   Smoking status: Never   Smokeless tobacco: Never  Substance and Sexual Activity   Alcohol use: Yes    Alcohol/week: 1.0 standard drink    Types: 1 Cans of beer per week    Comment: social   Drug use: No   Sexual activity: Yes  Other Topics Concern   Not on file  Social History Narrative   Daily caffeine 5-6 12oz drinks    Social Determinants of Health   Financial Resource Strain: Not on file  Food Insecurity: Not on file  Transportation Needs: Not on file  Physical Activity: Not on file  Stress: Not on file  Social Connections: Not on file  Intimate Partner Violence: Not on file      PHYSICAL EXAM  Vitals:   09/05/21 0819  BP: 134/85  Pulse: 70  Weight: (!) 304 lb 3.2 oz (138 kg)  Height: 6\' 4"  (1.93 Rangel)   Body mass index is 37.03 kg/Rangel.  Generalized: Well developed, in no acute distress  Chest: Lungs clear to auscultation bilaterally  Neurological examination  Mentation: Alert oriented to time, place, history taking. Follows all commands speech and language fluent Cranial nerve II-XII: Extraocular movements were full, visual field were full on confrontational test Head turning and shoulder shrug  were normal and symmetric. Motor: The motor testing reveals 5 over 5 strength of all 4 extremities. Good symmetric motor tone is noted throughout.  Sensory: Sensory testing is intact to soft touch on all 4 extremities. No evidence of extinction is noted.  Gait and station: Gait is normal.    DIAGNOSTIC DATA (LABS, IMAGING,  TESTING) - I reviewed patient records, labs, notes, testing and imaging myself where available.  Lab Results  Component Value Date   HGB 17.3 (H) 08/02/2015       ASSESSMENT AND PLAN 50 y.o. year old male  has a past medical history of Anal fissure, AR (allergic rhinitis), Asthma, ED (erectile dysfunction), GERD (gastroesophageal reflux disease), Hypogonadism male, Impaired fasting glucose, Intertriginous candidiasis, Metabolic syndrome, Metabolic syndrome X, Obesity, OSA (obstructive sleep apnea), Pilonidal cyst, and Rupture of plantaris tendon. here  with:  OSA on CPAP  - CPAP compliance excellent - Good treatment of AHI  - Encourage patient to use CPAP nightly and > 4 hours each night - F/U in 1 year or sooner if needed   I spent 20 minutes of face-to-face and non-face-to-face time with patient.  This included previsit chart review, lab review, study review, order entry, electronic health record documentation, patient education.  Butch Penny, MSN, NP-C 09/05/2021, 8:37 AM [facility] [address], [address] [place], [place] [address] [phone]  I reviewed the above note and documentation by the Nurse Practitioner and agree with the history, exam, assessment and plan as outlined above. I was available for consultation. Huston Foley, MD, PhD [facility])

## 2021-09-18 DIAGNOSIS — I8002 Phlebitis and thrombophlebitis of superficial vessels of left lower extremity: Secondary | ICD-10-CM | POA: Diagnosis not present

## 2021-11-06 DIAGNOSIS — Z125 Encounter for screening for malignant neoplasm of prostate: Secondary | ICD-10-CM | POA: Diagnosis not present

## 2021-11-06 DIAGNOSIS — R7301 Impaired fasting glucose: Secondary | ICD-10-CM | POA: Diagnosis not present

## 2021-11-06 DIAGNOSIS — E785 Hyperlipidemia, unspecified: Secondary | ICD-10-CM | POA: Diagnosis not present

## 2021-11-06 DIAGNOSIS — E298 Other testicular dysfunction: Secondary | ICD-10-CM | POA: Diagnosis not present

## 2021-11-13 DIAGNOSIS — R82998 Other abnormal findings in urine: Secondary | ICD-10-CM | POA: Diagnosis not present

## 2021-11-13 DIAGNOSIS — Z0000 Encounter for general adult medical examination without abnormal findings: Secondary | ICD-10-CM | POA: Diagnosis not present

## 2021-11-13 DIAGNOSIS — R7301 Impaired fasting glucose: Secondary | ICD-10-CM | POA: Diagnosis not present

## 2021-11-13 DIAGNOSIS — Z1339 Encounter for screening examination for other mental health and behavioral disorders: Secondary | ICD-10-CM | POA: Diagnosis not present

## 2021-11-13 DIAGNOSIS — Z1331 Encounter for screening for depression: Secondary | ICD-10-CM | POA: Diagnosis not present

## 2021-11-19 DIAGNOSIS — R030 Elevated blood-pressure reading, without diagnosis of hypertension: Secondary | ICD-10-CM | POA: Diagnosis not present

## 2021-11-19 DIAGNOSIS — M4310 Spondylolisthesis, site unspecified: Secondary | ICD-10-CM | POA: Diagnosis not present

## 2021-11-19 DIAGNOSIS — M5416 Radiculopathy, lumbar region: Secondary | ICD-10-CM | POA: Diagnosis not present

## 2021-11-19 DIAGNOSIS — Z6837 Body mass index (BMI) 37.0-37.9, adult: Secondary | ICD-10-CM | POA: Diagnosis not present

## 2021-12-18 DIAGNOSIS — M5416 Radiculopathy, lumbar region: Secondary | ICD-10-CM | POA: Diagnosis not present

## 2022-09-09 ENCOUNTER — Encounter: Payer: Self-pay | Admitting: *Deleted

## 2022-09-10 ENCOUNTER — Telehealth (INDEPENDENT_AMBULATORY_CARE_PROVIDER_SITE_OTHER): Payer: Managed Care, Other (non HMO) | Admitting: Adult Health

## 2022-09-10 DIAGNOSIS — G4733 Obstructive sleep apnea (adult) (pediatric): Secondary | ICD-10-CM

## 2022-09-10 DIAGNOSIS — Z9989 Dependence on other enabling machines and devices: Secondary | ICD-10-CM

## 2022-09-10 NOTE — Progress Notes (Signed)
PATIENT: John Rangel DOB: September 28, 1971  REASON FOR VISIT: follow up HISTORY FROM: patient  Virtual Visit via Video Note  I connected with John Rangel on 09/10/22 at  3:15 PM EDT by a video enabled telemedicine application located remotely at [facility] and verified that I am speaking with the correct person using two identifiers who was located at their own home.   I discussed the limitations of evaluation and management by telemedicine and the availability of in person appointments. The patient expressed understanding and agreed to proceed.   PATIENT: John Rangel DOB: January 17, 1971  REASON FOR VISIT: follow up HISTORY FROM: patient  HISTORY OF PRESENT ILLNESS: Today 09/10/22  HISTORY John Rangel is a 51 year old male with a history of obstructive sleep apnea on CPAP.  He returns today for follow-up.  His download is below.  He reports that CPAP is working well for him.  He does state that he needs to change out his supplies.  Reports that the machine is making a clicking noise.  He has not discussed with his DME.    09/05/21: John Rangel is a 51 year old male with a history of obstructive sleep apnea on CPAP.  He returns today for follow-up.  He reports that the CPAP is working well.  Denies any new issues.  Returns today for an evaluation.    09/04/20: John Rangel is a 51 year old male with a history of obstructive sleep apnea on CPAP.  His download indicates that he uses machine nightly for compliance of 100%.  He uses machine greater than 4 hours 29 days for compliance of 97%.  On average he uses his machine 7 hours and 20 minutes.  His residual AHI is 4.3 on 9 cm of water with EPR of 3.  Leak in the 95th percentile is 6.6 L/min.  HISTORY 09/01/19 :   John Rangel is a 51 year old male with a history of obstructive sleep apnea on CPAP.  His download indicates that he uses machine 27 out of 30 days for compliance of 90%.  He uses machine greater than 4 hours  25 out of 30 days for compliance of 83%.  On average he uses his machine 6 hours and 45 minutes.  His residual AHI is 1.6 on 9 cm of water with EPR 3.  His leak in the 95th percentile is 9.  Overall he is doing well.  He denies any new issues.  He returns today for an evaluation.  REVIEW OF SYSTEMS: Out of a complete 14 system review of symptoms, the patient complains only of the following symptoms, and all other reviewed systems are negative.  ALLERGIES: No Known Allergies  HOME MEDICATIONS: Outpatient Medications Prior to Visit  Medication Sig Dispense Refill   cyclobenzaprine (FLEXERIL) 10 MG tablet Take 10 mg by mouth 3 (three) times daily as needed for muscle spasms.     fluocinolone (VANOS) 0.01 % cream Apply topically as needed.     fluticasone (FLONASE) 50 MCG/ACT nasal spray Place into both nostrils daily.     ibuprofen (ADVIL) 200 MG tablet Take 400-600 mg by mouth every 6 (six) hours as needed.     ketotifen (ZADITOR) 0.025 % ophthalmic solution 1 drop 2 (two) times daily.     Multiple Vitamin (MULTIVITAMIN) tablet Take 1 tablet by mouth daily.     naproxen sodium (ALEVE) 220 MG tablet Take 440 mg by mouth daily as needed.     omeprazole-sodium bicarbonate (ZEGERID) 40-1100 MG capsule Take  1 capsule by mouth daily before breakfast.  10   Testosterone 20.25 MG/ACT (1.62%) GEL daily.  4   triamcinolone cream (KENALOG) 0.5 % Apply 1 application topically daily.     Facility-Administered Medications Prior to Visit  Medication Dose Route Frequency Provider Last Rate Last Admin   betamethasone acetate-betamethasone sodium phosphate (CELESTONE) injection 3 mg  3 mg Intramuscular Once John Rangel M, DPM       betamethasone acetate-betamethasone sodium phosphate (CELESTONE) injection 3 mg  3 mg Intramuscular Once John Rangel, DPM        PAST MEDICAL HISTORY: Past Medical History:  Diagnosis Date   Anal fissure    AR (allergic rhinitis)    Asthma    Back pain    L4 broken  vertebrae   ED (erectile dysfunction)    GERD (gastroesophageal reflux disease)    Hypogonadism male    Impaired fasting glucose    Intertriginous candidiasis    Metabolic syndrome    Metabolic syndrome X    Obesity    OSA (obstructive sleep apnea)    Pilonidal cyst    Rupture of plantaris tendon     PAST SURGICAL HISTORY: Past Surgical History:  Procedure Laterality Date   KNEE ARTHROSCOPY Left 2004   KNEE ARTHROSCOPY Right 08/02/2015   Procedure: KNEE ARTHROSCOPY WITH REMOVAL LOOSE BODIES AND DEBRIDEMENT CHONDROMALACIA;  Surgeon: Gean Birchwood, MD;  Location: [facility];  Service: Orthopedics;  Laterality: Right;   LASIK     VASECTOMY      FAMILY HISTORY: Family History  Problem Relation Age of Onset   Hypertension Father    Diabetes Mellitus II Father    Hyperlipidemia Father    Thyroid disease Father    Colon polyps Father    Thyroid disease Mother    Colon polyps Mother    COPD Paternal Grandmother    Heart attack Paternal Grandfather    Colon cancer Paternal Aunt 30    SOCIAL HISTORY: Social History   Socioeconomic History   Marital status: Married    Spouse name: Vikki Ports    Number of children: 3   Years of education: Assoc   Highest education level: Not on file  Occupational History   Occupation: Passenger transport manager  Tobacco Use   Smoking status: Never   Smokeless tobacco: Never  Substance and Sexual Activity   Alcohol use: Yes    Alcohol/week: 1.0 standard drink of alcohol    Types: 1 Cans of beer per week    Comment: social   Drug use: No   Sexual activity: Yes  Other Topics Concern   Not on file  Social History Narrative   Daily caffeine 5-6 12oz drinks    Social Determinants of Health   Financial Resource Strain: Not on file  Food Insecurity: Not on file  Transportation Needs: Not on file  Physical Activity: Not on file  Stress: Not on file  Social Connections: Not on file  Intimate Partner Violence: Not on file       PHYSICAL EXAM Generalized: Well developed, in no acute distress   Neurological examination  Mentation: Alert oriented to time, place, history taking. Follows all commands speech and language fluent Cranial nerve II-XII:Extraocular movements were full. Facial symmetry noted.  Head turning and shoulder shrug  were normal and symmetric.   DIAGNOSTIC DATA (LABS, IMAGING, TESTING) - I reviewed patient records, labs, notes, testing and imaging myself where available.  Lab Results  Component Value Date   HGB  17.3 (H) 08/02/2015     ASSESSMENT AND PLAN 51 y.o. year old male  has a past medical history of Anal fissure, AR (allergic rhinitis), Asthma, Back pain, ED (erectile dysfunction), GERD (gastroesophageal reflux disease), Hypogonadism male, Impaired fasting glucose, Intertriginous candidiasis, Metabolic syndrome, Metabolic syndrome X, Obesity, OSA (obstructive sleep apnea), Pilonidal cyst, and Rupture of plantaris tendon. here with:  1.  Obstructive sleep apnea on CPAP  Good compliance Good treatment of apnea Encouraged patient to use CPAP nightly and greater than 4 hours each night Advised if symptoms worsen or he develops new symptoms he should let us know Follow-up in 1 year or sooner if needed      Ward Givens, MSN, NP-C 09/10/2022, 3:03 PM [facility] [address] [place], [place] [address] [phone]

## 2023-09-01 ENCOUNTER — Encounter: Payer: Self-pay | Admitting: *Deleted

## 2023-09-02 ENCOUNTER — Telehealth: Payer: Managed Care, Other (non HMO) | Admitting: Adult Health

## 2023-09-02 DIAGNOSIS — G4733 Obstructive sleep apnea (adult) (pediatric): Secondary | ICD-10-CM

## 2024-09-06 ENCOUNTER — Encounter: Payer: Self-pay | Admitting: *Deleted

## 2024-09-07 ENCOUNTER — Telehealth (INDEPENDENT_AMBULATORY_CARE_PROVIDER_SITE_OTHER): Payer: Managed Care, Other (non HMO) | Admitting: Adult Health

## 2024-09-07 DIAGNOSIS — G4733 Obstructive sleep apnea (adult) (pediatric): Secondary | ICD-10-CM

## 2024-09-07 NOTE — Patient Instructions (Signed)
 Continue using CPAP nightly and greater than 4 hours each night If your symptoms worsen or you develop new symptoms please let us  know.

## 2024-09-07 NOTE — Progress Notes (Signed)
 PATIENT: John Rangel DOB: [birth year]  REASON FOR VISIT: follow up HISTORY FROM: patient  Virtual Visit via Video Note  I connected with John Rangel on 09/07/24 at  1:00 PM EDT by a video enabled telemedicine application located remotely at [facility] and verified that I am speaking with the correct person using two identifiers who was located at their own home in [place]   I discussed the limitations of evaluation and management by telemedicine and the availability of in person appointments. The patient expressed understanding and agreed to proceed.   PATIENT: John Rangel DOB: Nov 24, 1971  REASON FOR VISIT: follow up HISTORY FROM: patient  HISTORY OF PRESENT ILLNESS: Today 09/07/24:  John Rangel is a 53 y.o. male with a history of OSA on CPAP. Returns today for follow-up.  Reports that CPAP is working well.  Continues to change his supplies regularly.  Currently wears the full face. Returns today for an evaluation.     HISTORY 09/10/22:  [name] is a 53 year old male with a history of obstructive sleep apnea on CPAP.  He returns today for follow-up.  His download is below.  He reports that CPAP is working well for him.  He does state that he needs to change out his supplies.  Reports that the machine is making a clicking noise.  He has not discussed with his DME.    09/05/21: [name] is a 53 year old male with a history of obstructive sleep apnea on CPAP.  He returns today for follow-up.  He reports that the CPAP is working well.  Denies any new issues.  Returns today for an evaluation.    09/04/20: [name] is a 53 year old male with a history of obstructive sleep apnea on CPAP.  His download indicates that he uses machine nightly for compliance of 100%.  He uses machine greater than 4 hours 29 days for compliance of 97%.  On average he uses his machine 7 hours and 20 minutes.  His residual AHI is 4.3 on 9 cm of water with EPR of 3.  Leak in the  95th percentile is 6.6 L/min.  HISTORY 09/01/19 :   [name] is a 53 year old male with a history of obstructive sleep apnea on CPAP.  His download indicates that he uses machine 27 out of 30 days for compliance of 90%.  He uses machine greater than 4 hours 25 out of 30 days for compliance of 83%.  On average he uses his machine 6 hours and 45 minutes.  His residual AHI is 1.6 on 9 cm of water with EPR 3.  His leak in the 95th percentile is 9.  Overall he is doing well.  He denies any new issues.  He returns today for an evaluation.  REVIEW OF SYSTEMS: Out of a complete 14 system review of symptoms, the patient complains only of the following symptoms, and all other reviewed systems are negative.  ALLERGIES: No Known Allergies  HOME MEDICATIONS: Outpatient Medications Prior to Visit  Medication Sig Dispense Refill   cyclobenzaprine (FLEXERIL) 10 MG tablet Take 10 mg by mouth 3 (three) times daily as needed for muscle spasms.     fluocinolone (VANOS) 0.01 % cream Apply topically as needed.     fluticasone (FLONASE) 50 MCG/ACT nasal spray Place into both nostrils daily.     ibuprofen (ADVIL) 200 MG tablet Take 400-600 mg by mouth every 6 (six) hours as needed.     ketotifen (ZADITOR) 0.025 %  ophthalmic solution 1 drop 2 (two) times daily.     Multiple Vitamin (MULTIVITAMIN) tablet Take 1 tablet by mouth daily.     naproxen sodium (ALEVE) 220 MG tablet Take 440 mg by mouth daily as needed.     omeprazole-sodium bicarbonate (ZEGERID) 40-1100 MG capsule Take 1 capsule by mouth daily before breakfast.  10   Testosterone  20.25 MG/ACT (1.62%) GEL daily.  4   triamcinolone cream (KENALOG) 0.5 % Apply 1 application topically daily.     Facility-Administered Medications Prior to Visit  Medication Dose Route Frequency Provider Last Rate Last Admin   betamethasone  acetate-betamethasone  sodium phosphate  (CELESTONE ) injection 3 mg  3 mg Intramuscular Once Evans, Brent M, DPM       betamethasone   acetate-betamethasone  sodium phosphate  (CELESTONE ) injection 3 mg  3 mg Intramuscular Once Janit Thresa HERO, DPM        PAST MEDICAL HISTORY: Past Medical History:  Diagnosis Date   Anal fissure    AR (allergic rhinitis)    Asthma    Back pain    L4 broken vertebrae   ED (erectile dysfunction)    GERD (gastroesophageal reflux disease)    Hypogonadism male    Impaired fasting glucose    Intertriginous candidiasis    Metabolic syndrome    Metabolic syndrome X    Obesity    OSA (obstructive sleep apnea)    Pilonidal cyst    Rupture of plantaris tendon     PAST SURGICAL HISTORY: Past Surgical History:  Procedure Laterality Date   KNEE ARTHROSCOPY Left 2004   KNEE ARTHROSCOPY Right 08/02/2015   Procedure: KNEE ARTHROSCOPY WITH REMOVAL LOOSE BODIES AND DEBRIDEMENT CHONDROMALACIA;  Surgeon: Dempsey Sensor, MD;  Location: [facility];  Service: Orthopedics;  Laterality: Right;   LASIK     VASECTOMY      FAMILY HISTORY: Family History  Problem Relation Age of Onset   Hypertension Father    Diabetes Mellitus II Father    Hyperlipidemia Father    Thyroid disease Father    Colon polyps Father    Thyroid disease Mother    Colon polyps Mother    COPD Paternal Grandmother    Heart attack Paternal Grandfather    Colon cancer Paternal Aunt 30    SOCIAL HISTORY: Social History   Socioeconomic History   Marital status: Married    Spouse name: Berwyn    Number of children: 3   Years of education: Assoc   Highest education level: Not on file  Occupational History   Occupation: Passenger transport manager  Tobacco Use   Smoking status: Never   Smokeless tobacco: Never  Substance and Sexual Activity   Alcohol  use: Yes    Alcohol /week: 1.0 standard drink of alcohol     Types: 1 Cans of beer per week    Comment: social   Drug use: No   Sexual activity: Yes  Other Topics Concern   Not on file  Social History Narrative   Daily caffeine 5-6 12oz drinks    Social  Drivers of Corporate investment banker Strain: Not on file  Food Insecurity: Not on file  Transportation Needs: Not on file  Physical Activity: Not on file  Stress: Not on file  Social Connections: Unknown (04/29/2022)   [facility]   Social Network    Social Network: Not on file  Intimate Partner Violence: Unknown (03/21/2022)   Received from [facility]   HITS    Physically Hurt: Not on file  Insult or Talk Down To: Not on file    Threaten Physical Harm: Not on file    Scream or Curse: Not on file      PHYSICAL EXAM Generalized: Well developed, in no acute distress   Neurological examination  Mentation: Alert oriented to time, place, history taking. Follows all commands speech and language fluent Cranial nerve II-XII:Extraocular movements were full. Facial symmetry noted.  Head turning and shoulder shrug  were normal and symmetric.   DIAGNOSTIC DATA (LABS, IMAGING, TESTING) - I reviewed patient records, labs, notes, testing and imaging myself where available.  Lab Results  Component Value Date   HGB 17.3 (H) 08/02/2015     ASSESSMENT AND PLAN 53 y.o. year old male  has a past medical history of Anal fissure, AR (allergic rhinitis), Asthma, Back pain, ED (erectile dysfunction), GERD (gastroesophageal reflux disease), Hypogonadism male, Impaired fasting glucose, Intertriginous candidiasis, Metabolic syndrome, Metabolic syndrome X, Obesity, OSA (obstructive sleep apnea), Pilonidal cyst, and Rupture of plantaris tendon. here with:  1.  Obstructive sleep apnea on CPAP  Good compliance Good treatment of apnea Encouraged patient to use CPAP nightly and greater than 4 hours each night Last sleep study was in October 2017 split-night study Advised if symptoms worsen or he develops new symptoms he should let us  know Follow-up in 1 year or sooner if needed    Duwaine Russell, MSN, NP-C 09/07/2024, 11:21 AM [facility] [address],  [address] [place], [place] [address] [phone]  The patient's condition requires frequent monitoring and adjustments in the treatment plan, reflecting the ongoing complexity of care.  This provider is the continuing focal point for all needed services for this condition.

## 2024-09-27 ENCOUNTER — Encounter: Payer: Self-pay | Admitting: Adult Health

## 2024-09-27 DIAGNOSIS — G4733 Obstructive sleep apnea (adult) (pediatric): Secondary | ICD-10-CM

## 2024-10-27 ENCOUNTER — Ambulatory Visit: Admitting: Neurology

## 2024-10-27 DIAGNOSIS — G4733 Obstructive sleep apnea (adult) (pediatric): Secondary | ICD-10-CM

## 2024-10-27 DIAGNOSIS — G4734 Idiopathic sleep related nonobstructive alveolar hypoventilation: Secondary | ICD-10-CM

## 2024-10-28 NOTE — Progress Notes (Unsigned)
 John Rangel

## 2024-10-29 ENCOUNTER — Ambulatory Visit: Payer: Self-pay | Admitting: Adult Health

## 2024-10-29 DIAGNOSIS — G4733 Obstructive sleep apnea (adult) (pediatric): Secondary | ICD-10-CM

## 2024-10-29 NOTE — Procedures (Signed)
 [facility]  HOME SLEEP TEST (Watch PAT) REPORT  STUDY DATE: 10/27/2024  DOB: 09-13-1971  MRN: [id number]  ORDERING CLINICIAN: True Mar, MD, PhD   REFERRING CLINICIAN: Duwaine Russell, NP  CLINICAL INFORMATION/HISTORY (obtained from visit note dated 09/07/2024): 53 year old male with an underlying medical history of allergies, asthma, reflux disease, hypogonadism, and obesity, who presents for reevaluation of his obstructive sleep apnea.  He has been on CPAP of 9 cm with EPR of 3 with full compliance, good apnea control and good tolerance of treatment.SABRA  He should be eligible for a new machine.  FINDINGS:   Sleep Summary:   Total Recording Time (hours, min): 8 hours, 44 min  Total Sleep Time (hours, min):  7 hours, 19 min  Percent REM (%):    20%   Respiratory Indices:   Calculated pAHI (per hour):  36.4/hour         REM pAHI:    30.1/hour       NREM pAHI: 38.1/hour  Central pAHI: 2.3/hour  Oxygen Saturation Statistics:    Oxygen Saturation (%) Mean: 92%   Minimum oxygen saturation (%):                 80%   O2 Saturation Range (%): 80-99%    O2 Saturation (minutes) <=88%: 17.9 min  Pulse Rate Statistics:   Pulse Mean (bpm):    79/min    Pulse Range (63-103/min)   IMPRESSION:   OSA (obstructive sleep apnea), severe Nocturnal Hypoxemia  RECOMMENDATION:  This home sleep test demonstrates severe obstructive sleep apnea with a total AHI of 36.4/hour and O2 nadir of 80% with significant time below or at 88% saturation of over 15 minutes for the study, indicating nocturnal hypoxemia. Snoring was detected, in the mild to mild range.  Ongoing treatment with positive airway pressure is highly recommended. The patient has been on CPAP at a pressure of 9 cm with EPR of 3 with good apnea control and good tolerance of treatment.  I would recommend keeping his settings the same, mask of choice, sized to fit.  Full compliance should be encouraged on  an ongoing basis. A laboratory attended titration study can be considered in the future for optimization of treatment settings and to improve tolerance and compliance, if needed, down the road. Alternative treatment options are limited secondary to the severity of the patient's sleep disordered breathing, but may include surgical treatment with an implantable hypoglossal nerve stimulator (in carefully selected candidates, meeting criteria).  Concomitant weight loss is recommended (where clinically appropriate). Please note, that untreated obstructive sleep apnea may carry additional perioperative morbidity. Patients with significant obstructive sleep apnea should receive perioperative PAP therapy and the surgeons and particularly the anesthesiologist should be informed of the diagnosis and the severity of the sleep disordered breathing. The patient should be cautioned not to drive, work at heights, or operate dangerous or heavy equipment when tired or sleepy. Review and reiteration of good sleep hygiene measures should be pursued with any patient. Other causes of the patient's symptoms, including circadian rhythm disturbances, an underlying mood disorder, medication effect and/or an underlying medical problem cannot be ruled out based on this test. Clinical correlation is recommended.  The patient and his referring provider will be notified of the test results. The patient will be seen in follow up in sleep clinic at [facility].  I certify that I have reviewed the raw data recording prior to the issuance of this report in accordance with the standards of  the American Academy of Sleep Medicine (AASM).    INTERPRETING PHYSICIAN:   True Mar, MD, PhD Medical Director, [facility]) Diplomat, ABPN (Neurology and Sleep)   [facility] [address], [address] [place], [place] [address] [phone]

## 2024-11-01 NOTE — Telephone Encounter (Signed)
 Spoke to patient made him aware resending orders to [facility] today as urgent set up due to severe OSA Made initial f/u 12/2024 with Megan,NP Pt expressed understanding and thanked me for calling

## 2024-12-21 NOTE — Progress Notes (Signed)
 SABRA

## 2024-12-22 ENCOUNTER — Encounter: Admitting: Adult Health

## 2024-12-22 ENCOUNTER — Encounter: Payer: Self-pay | Admitting: Adult Health

## 2024-12-23 NOTE — Progress Notes (Signed)
 This encounter was created in error - please disregard.

## 2025-02-24 ENCOUNTER — Encounter: Admitting: Adult Health

## 2025-03-04 ENCOUNTER — Encounter: Admitting: Adult Health

## 2025-09-06 ENCOUNTER — Telehealth: Admitting: Adult Health
